# Patient Record
Sex: Female | Born: 1970 | Race: Black or African American | Hispanic: No | Marital: Single | Smoking: Never smoker
Health system: Southern US, Community
[De-identification: ages and names within clinical notes are randomized; demographics above are authoritative.]

---

## 2004-08-27 ENCOUNTER — Other Ambulatory Visit: Admission: RE | Admit: 2004-08-27 | Discharge: 2004-08-27 | Payer: Self-pay | Admitting: Obstetrics and Gynecology

## 2006-09-07 ENCOUNTER — Encounter: Admission: RE | Admit: 2006-09-07 | Discharge: 2006-09-07 | Payer: Self-pay | Admitting: Obstetrics and Gynecology

## 2010-05-20 ENCOUNTER — Encounter
Admission: RE | Admit: 2010-05-20 | Discharge: 2010-05-20 | Payer: Self-pay | Source: Home / Self Care | Attending: Obstetrics and Gynecology | Admitting: Obstetrics and Gynecology

## 2011-06-29 ENCOUNTER — Other Ambulatory Visit: Payer: Self-pay | Admitting: Obstetrics and Gynecology

## 2011-06-29 ENCOUNTER — Other Ambulatory Visit: Payer: Self-pay | Admitting: Family Medicine

## 2011-06-29 DIAGNOSIS — Z1231 Encounter for screening mammogram for malignant neoplasm of breast: Secondary | ICD-10-CM

## 2011-07-08 ENCOUNTER — Ambulatory Visit
Admission: RE | Admit: 2011-07-08 | Discharge: 2011-07-08 | Disposition: A | Discharge status: Home / Self Care | Payer: BC Managed Care – PPO | Source: Ambulatory Visit | Attending: Family Medicine | Admitting: Family Medicine

## 2011-07-08 DIAGNOSIS — Z1231 Encounter for screening mammogram for malignant neoplasm of breast: Secondary | ICD-10-CM

## 2012-05-10 ENCOUNTER — Ambulatory Visit (INDEPENDENT_AMBULATORY_CARE_PROVIDER_SITE_OTHER): Payer: BC Managed Care – PPO | Admitting: Obstetrics and Gynecology

## 2012-05-10 ENCOUNTER — Encounter: Payer: Self-pay | Admitting: Obstetrics and Gynecology

## 2012-05-10 VITALS — BP 100/62 | HR 72 | Resp 16 | Ht 68.0 in | Wt 181.0 lb

## 2012-05-10 DIAGNOSIS — Z01419 Encounter for gynecological examination (general) (routine) without abnormal findings: Secondary | ICD-10-CM

## 2012-05-10 DIAGNOSIS — R7689 Other specified abnormal immunological findings in serum: Secondary | ICD-10-CM | POA: Insufficient documentation

## 2012-05-10 DIAGNOSIS — N6459 Other signs and symptoms in breast: Secondary | ICD-10-CM

## 2012-05-10 DIAGNOSIS — D259 Leiomyoma of uterus, unspecified: Secondary | ICD-10-CM

## 2012-05-10 DIAGNOSIS — N6452 Nipple discharge: Secondary | ICD-10-CM | POA: Insufficient documentation

## 2012-05-10 DIAGNOSIS — D219 Benign neoplasm of connective and other soft tissue, unspecified: Secondary | ICD-10-CM | POA: Insufficient documentation

## 2012-05-10 DIAGNOSIS — R768 Other specified abnormal immunological findings in serum: Secondary | ICD-10-CM

## 2012-05-10 DIAGNOSIS — R894 Abnormal immunological findings in specimens from other organs, systems and tissues: Secondary | ICD-10-CM

## 2012-05-10 MED ORDER — VALACYCLOVIR HCL 500 MG PO TABS: 500.0000 mg | ORAL_TABLET | ORAL | Status: AC | PRN

## 2012-05-10 NOTE — Progress Notes (Signed)
AEX Last Pap: 04/30/2010 WNL: Yes Regular Periods:yes Contraception: None  Monthly Breast exam:yes Tetanus<3yrs:yes Nl.Bladder Function:yes Daily BMs:yes Healthy Diet:yes Calcium:no Mammogram:yes Date of Mammogram: 05/20/2010 WNL Exercise:yes Have often Exercise: 2-3 x wk Seatbelt: yes Abuse at home: no Stressful work:no Sigmoid-colonoscopy: none Bone Density: No PCP: Dr. Janeice Robinson Change in PMH: None Change in JYN:WGNF  Subjective:    Jennifer [name] is a 41 y.o. female, No obstetric history on file., who presents for an annual exam.     History   Social History  . Marital Status: Unknown    Spouse Name: N/A    Number of Children: N/A  . Years of Education: N/A   Social History Main Topics  . Smoking status: Never Smoker   . Smokeless tobacco: None  . Alcohol Use: Yes  . Drug Use: No  . Sexually Active: Yes    Birth Control/ Protection: None   Other Topics Concern  . None   Social History Narrative  . None    Menstrual cycle:   LMP: Patient's last menstrual period was 04/07/2012.           Cycle: Total 8 days of bleeding , starting out light for a few days, only one heavy day then tapers.  No IM bleeding.  everyother month mild cramps which require no meds The following portions of the patient's history were reviewed and updated as appropriate: allergies, current medications, past family history, past medical history, past social history, past surgical history and problem list.  Review of Systems Pertinent items are noted in HPI. Breast:Negative for breast lump or nipple retraction.  She has a 5 year hx of first expressable and now spontaneous clear left breast discharge.  Neg mammogram in 2012. Gastrointestinal: Negative for abdominal pain, change in bowel habits or rectal bleeding Urinary:negative   Objective:    BP 100/62  Pulse 72  Resp 16  Ht 5\' 8"  (1.727 m)  Wt 181 lb (82.101 kg)  BMI 27.52 kg/m2  LMP 04/07/2012    Weight:  Wt Readings from  Last 1 Encounters:  05/10/12 181 lb (82.101 kg)          BMI: Body mass index is 27.52 kg/(m^2).  General Appearance: Alert, appropriate appearance for age. No acute distress HEENT: Grossly normal Neck / Thyroid: Supple, no masses, nodes or enlargement Lungs: clear to auscultation bilaterally Back: No CVA tenderness Breast Exam: No masses or nodes.No dimpling, nipple retraction.  Easily expressable clear discharge from the left breast Cardiovascular: Regular rate and rhythm. S1, S2, no murmur Gastrointestinal: Soft, non-tender, no masses or organomegaly Pelvic Exam: External genitalia: normal general appearance Vaginal: normal mucosa without prolapse or lesions Cervix: normal appearance Adnexa: no masses Uterus: irregular enlargement 8-10 wks size Rectovaginal: normal rectal, no masses Lymphatic Exam: Non-palpable nodes in neck, clavicular, axillary, or inguinal regions  Skin: no rash or abnormalities Neurologic: Normal gait and speech, no tremor  Psychiatric: Alert and oriented, appropriate affect.   Wet Prep:not applicable Urinalysis:not applicable UPT: Not done   Assessment:    Stable asymptomatic fibroids  Persistent, now spontaneous left breast discharge   Plan:   left breast pap Mammogram/ductogram pap smear due 2014.  No hx abnl return annually or prn STD screening: declined Contraception:condoms   Dierdre Forth MD

## 2012-05-12 LAB — CYTOLOGY - NON PAP

## 2012-05-13 ENCOUNTER — Other Ambulatory Visit: Payer: Self-pay

## 2012-05-13 DIAGNOSIS — N6452 Nipple discharge: Secondary | ICD-10-CM

## 2012-05-23 ENCOUNTER — Ambulatory Visit
Admission: RE | Admit: 2012-05-23 | Discharge: 2012-05-23 | Disposition: A | Discharge status: Home / Self Care | Payer: BC Managed Care – PPO | Source: Ambulatory Visit | Attending: Obstetrics and Gynecology | Admitting: Obstetrics and Gynecology

## 2012-05-23 ENCOUNTER — Ambulatory Visit
Admission: RE | Admit: 2012-05-23 | Discharge: 2012-05-23 | Disposition: A | Discharge status: Home / Self Care | Payer: Self-pay | Source: Ambulatory Visit | Attending: Obstetrics and Gynecology | Admitting: Obstetrics and Gynecology

## 2012-05-23 ENCOUNTER — Encounter: Payer: Self-pay | Admitting: Obstetrics and Gynecology

## 2012-05-23 DIAGNOSIS — N6452 Nipple discharge: Secondary | ICD-10-CM

## 2012-05-23 DIAGNOSIS — R922 Inconclusive mammogram: Secondary | ICD-10-CM | POA: Insufficient documentation

## 2012-08-01 ENCOUNTER — Other Ambulatory Visit: Payer: Self-pay | Admitting: Obstetrics and Gynecology

## 2012-08-01 DIAGNOSIS — N6452 Nipple discharge: Secondary | ICD-10-CM

## 2012-11-21 ENCOUNTER — Ambulatory Visit
Admission: RE | Admit: 2012-11-21 | Discharge: 2012-11-21 | Disposition: A | Discharge status: Home / Self Care | Payer: BC Managed Care – PPO | Source: Ambulatory Visit | Attending: Obstetrics and Gynecology | Admitting: Obstetrics and Gynecology

## 2012-11-21 DIAGNOSIS — N6452 Nipple discharge: Secondary | ICD-10-CM

## 2014-04-13 ENCOUNTER — Other Ambulatory Visit: Payer: Self-pay | Admitting: Family Medicine

## 2014-04-13 DIAGNOSIS — N63 Unspecified lump in unspecified breast: Secondary | ICD-10-CM

## 2014-04-26 ENCOUNTER — Ambulatory Visit
Admission: RE | Admit: 2014-04-26 | Discharge: 2014-04-26 | Disposition: A | Discharge status: Home / Self Care | Payer: BC Managed Care – PPO | Source: Ambulatory Visit | Attending: Family Medicine | Admitting: Family Medicine

## 2014-04-26 ENCOUNTER — Encounter (INDEPENDENT_AMBULATORY_CARE_PROVIDER_SITE_OTHER): Payer: Self-pay

## 2014-04-26 DIAGNOSIS — N63 Unspecified lump in unspecified breast: Secondary | ICD-10-CM

## 2015-06-26 ENCOUNTER — Other Ambulatory Visit: Payer: Self-pay | Admitting: Obstetrics and Gynecology

## 2015-06-26 DIAGNOSIS — R928 Other abnormal and inconclusive findings on diagnostic imaging of breast: Secondary | ICD-10-CM

## 2015-07-03 ENCOUNTER — Other Ambulatory Visit: Payer: Self-pay

## 2015-07-09 ENCOUNTER — Other Ambulatory Visit: Payer: Self-pay

## 2015-09-06 ENCOUNTER — Ambulatory Visit: Payer: Self-pay | Admitting: Family

## 2015-09-11 ENCOUNTER — Encounter: Payer: Self-pay | Admitting: Family Medicine

## 2015-09-11 ENCOUNTER — Ambulatory Visit (INDEPENDENT_AMBULATORY_CARE_PROVIDER_SITE_OTHER): Payer: BLUE CROSS/BLUE SHIELD | Admitting: Family Medicine

## 2015-09-11 VITALS — BP 100/52 | HR 78 | Temp 98.7°F | Ht 68.5 in | Wt 183.0 lb

## 2015-09-11 DIAGNOSIS — M67952 Unspecified disorder of synovium and tendon, left thigh: Secondary | ICD-10-CM | POA: Diagnosis not present

## 2015-09-11 DIAGNOSIS — M545 Low back pain, unspecified: Secondary | ICD-10-CM

## 2015-09-11 DIAGNOSIS — M533 Sacrococcygeal disorders, not elsewhere classified: Secondary | ICD-10-CM

## 2015-09-11 MED ORDER — DICLOFENAC SODIUM 75 MG PO TBEC: 75.0000 mg | DELAYED_RELEASE_TABLET | Freq: Two times a day (BID) | ORAL | Status: DC

## 2015-09-11 NOTE — Progress Notes (Signed)
Pre visit review using our clinic review tool, if applicable. No additional management support is needed unless otherwise documented below in the visit note. 

## 2015-09-11 NOTE — Progress Notes (Signed)
Dr. Frederico Hamman T. Tinisha Etzkorn, MD, [facility] [place], [address] Phone: [phone] Fax: [phone]  09/11/2015  Patient: Jennifer [name], MRN: [id number], DOB: [birth year], 45 y.o.  Primary Physician:  Tawanna Solo, MD   Chief Complaint  Patient presents with  . Back Pain    Low Back   Subjective:   Jennifer [name] is a 45 y.o. very pleasant female patient who presents with the following: Back Pain  ongoing for approximately: 7-10 days The patient has had back pain before. The back pain is localized into the lumbar spine area. They also describe no radiculopathy. She is primarily having pain in the lower back as well as in the SI joint region.  She also has some left-sided upper buttocks pain.  Only new thing that she could've done that she can remember is using a stair climber in the gym for some time, and this was a little bit over a week ago.  She has taken some Advil which has not helped all that much.  For about a week, some general soreneess and stiffness. Going out of town on sat.  Driving - then a plane flight.   Pain around buttocks and SI joints.  Gym and weights.  No incontinence.   L > R SI joints Glute medius  No numbness or tingling. No bowel or bladder incontinence. No focal weakness. Prior interventions: no Physical therapy: No Chiropractic manipulations: No Acupuncture: No Osteopathic manipulation: No Heat or cold: Minimal effect  Past Medical History, Surgical History, Family History, Medications, Allergies have been reviewed and updated if relevant.  Patient Active Problem List   Diagnosis Date Noted  . Dense breasts 05/23/2012  . 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 Fibroids 05/10/2012  . Nipple discharge 05/10/2012    No past medical history on file.  No past surgical history on file.  Social History   Social History  . Marital Status: Single    Spouse Name: N/A  . Number of  Children: N/A  . Years of Education: N/A   Occupational History  . Not on file.   Social History Main Topics  . Smoking status: Never Smoker   . Smokeless tobacco: Never Used  . Alcohol Use: 0.0 oz/week    0 Standard drinks or equivalent per week  . Drug Use: No  . Sexual Activity: Yes    Birth Control/ Protection: None   Other Topics Concern  . Not on file   Social History Narrative    Family History  Problem Relation Age of Onset  . Cancer Father     No Known Allergies  Medication list reviewed and updated in full in [facility].  GEN: No fevers, chills. Nontoxic. Primarily MSK c/o today. MSK: Detailed in the HPI GI: tolerating PO intake without difficulty Neuro: As above  Otherwise the pertinent positives of the ROS are noted above.    Objective:   Blood pressure 100/52, pulse 78, temperature 98.7 F (37.1 C), temperature source Oral, height 5' 8.5" (1.74 m), weight 183 lb (83.008 kg), last menstrual period 09/09/2015.  Gen: Well-developed,well-nourished,in no acute distress; alert,appropriate and cooperative throughout examination HEENT: Normocephalic and atraumatic without obvious abnormalities.  Ears, externally no deformities Pulm: Breathing comfortably in no respiratory distress Range of motion at  the waist: Flexion, rotation and lateral bending: preserved with approaching full flexion, full extension and lateral bending and rotational movements.  No echymosis or edema Rises to examination table with no  difficulty Gait: minimally antalgic  Inspection/Deformity: No abnormality Paraspinus T:  Mildly tender from L4-S1.  B Ankle Dorsiflexion (L5,4): 5/5 B Great Toe Dorsiflexion (L5,4): 5/5 Heel Walk (L5): WNL Toe Walk (S1): WNL Rise/Squat (L4): WNL, mild pain  SENSORY B Medial Foot (L4): WNL B Dorsum (L5): WNL B Lateral (S1): WNL Light Touch: WNL Pinprick: WNL  REFLEXES Knee (L4): 2+ Ankle (S1): 2+  B SLR, seated: neg B SLR, supine:  pain in L back only B FABER: mild mod pos L B Reverse FABER: mild ttp B Greater Troch: NT B Log Roll: neg B Stork: NT B Sciatic Notch: notably TTP B, L > R  ttender to palpation on the posterior pelvic rim on the left.  Radiology: No results found.  Assessment and Plan:   Sacroiliac joint dysfunction of both sides  Bilateral low back pain without sciatica  Tendinopathy of left gluteus medius  Suspect induction secondary to increased volume on stairmaster machine, new exercise.  SI joint dysfunction, acute on the left, and to a lesser degree on the right.  There is also some minor back involvement and pain in the gluteus minimus insertion on the posterior pelvis.    Reviewed piriformis equivalent stretching and rehabilitation with the patient along with some self SI maneuvers - handout given.  Would anticipate relatively short course.  Voltaren p.o. B.i.d. For now.  Follow-up: if her symptoms do not resolve, she is going to keep a previously scheduled appt with my partner Dr. Tamala Julian in 2 weeks.  New Prescriptions   DICLOFENAC (VOLTAREN) 75 MG EC TABLET    Take 1 tablet (75 mg total) by mouth 2 (two) times daily.   Patient Instructions  Sacroiliac Joint Mobilization and Rehab 2. hip abductor rotations. standing, hip flexion and rotation outward then inward. 3 sets, 15 reps. when can do comfortably, add ankle weights starting at 2 pounds.  4. rolling up and back knees to chest and rocking.     Signed,  Maud Deed. Siboney Requejo, MD   Patient's Medications  New Prescriptions   DICLOFENAC (VOLTAREN) 75 MG EC TABLET    Take 1 tablet (75 mg total) by mouth 2 (two) times daily.  Previous Medications   VALACYCLOVIR (VALTREX) 500 MG TABLET    Take 1 tablet (500 mg total) by mouth continuous as needed.  Modified Medications   No medications on file  Discontinued Medications   No medications on file

## 2015-09-11 NOTE — Patient Instructions (Signed)
Sacroiliac Joint Mobilization and Rehab 2. hip abductor rotations. standing, hip flexion and rotation outward then inward. 3 sets, 15 reps. when can do comfortably, add ankle weights starting at 2 pounds.  4. rolling up and back knees to chest and rocking.

## 2015-09-24 ENCOUNTER — Ambulatory Visit: Payer: Self-pay | Admitting: Family Medicine

## 2016-01-29 NOTE — Progress Notes (Signed)
[facility] [address] [place], [place] [address] Phone: [phone] Subjective:    CC: leg problem.  [id number]  Jennifer [name] is a 45 y.o. female coming in with complaint of leg problem. Patient has been diagnosed with sacroiliac joint dysfunction previously bilaterally. This was by another provider. Patient states Pain mostly seems to be on the posterior aspect the leg. Seems worse after sitting for long amount of time. When she goes down to tie her shoes seems to be worse. Sometimes can catch her breath. Denies any weakness. Seems to be worsening over the course of time. Patient is concerned because she would like to be active but is unable to do things such running. Patient states laying down flat though is seeming to be beneficial.     No past medical history on file. No past surgical history on file. Social History   Social History  . Marital status: Single    Spouse name: N/A  . Number of children: N/A  . Years of education: N/A   Social History Main Topics  . Smoking status: Never Smoker  . Smokeless tobacco: Never Used  . Alcohol use 0.0 oz/week  . Drug use: No  . Sexual activity: Yes    Birth control/ protection: None   Other Topics Concern  . None   Social History Narrative  . None   No Known Allergies Family History  Problem Relation Age of Onset  . Cancer Father     Past medical history, social, surgical and family history all reviewed in electronic medical record.  No pertanent information unless stated regarding to the chief complaint.   Review of Systems: No headache, visual changes, nausea, vomiting, diarrhea, constipation, dizziness, abdominal pain, skin rash, fevers, chills, night sweats, weight loss, swollen lymph nodes, body aches, joint swelling, muscle aches, chest pain, shortness of breath, mood changes.   Objective  Blood pressure 122/74, pulse 77, height 5' 8.5" (1.74 m), weight 195 lb (88.5 kg), SpO2 98  %.  General: No apparent distress alert and oriented x3 mood and affect normal, dressed appropriately.  HEENT: Pupils equal, extraocular movements intact  Respiratory: Patient's speak in full sentences and does not appear short of breath  Cardiovascular: No lower extremity edema, non tender, no erythema  Skin: Warm dry intact with no signs of infection or rash on extremities or on axial skeleton.  Abdomen: Soft nontender  Neuro: Cranial nerves II through XII are intact, neurovascularly intact in all extremities with 2+ DTRs and 2+ pulses.  Lymph: No lymphadenopathy of posterior or anterior cervical chain or axillae bilaterally.  Gait normal with good balance and coordination.  MSK:  Non tender with full range of motion and good stability and symmetric strength and tone of shoulders, elbows, wrist, hip, knee and ankles bilaterally.  Back Exam:  Inspection: Unremarkable  Motion: Flexion 25 deg with worsening radicular symptoms, Extension 35 deg, Side Bending to 45 deg bilaterally,  Rotation to 45 deg bilaterally  SLR laying: Positive left side XSLR laying: Negative  Palpable tenderness: Tender to palpation in the paraspinal musculature over L4-S1 on the left side FABER: negative. Sensory change: Gross sensation intact to all lumbar and sacral dermatomes.  Reflexes: 2+ at both patellar tendons, 2+ at achilles tendons, Babinski's downgoing.  Strength at foot  Plantar-flexion: 5/5 Dorsi-flexion: 5/5 Eversion: 5/5 Inversion: 5/5  Leg strength  Quad: 5/5 Hamstring: 5/5 Hip flexor: 5/5 Hip abductors: 4/5  Gait unremarkable.  Procedure note [id number]; 15 minutes spent  for Therapeutic exercises as stated in above notes.  This included exercises focusing on stretching, strengthening, with significant focus on eccentric aspects.   Low back exercises that included:  Pelvic tilt/bracing instruction to focus on control of the pelvic girdle and lower abdominal muscles  Glute strengthening exercises,  focusing on proper firing of the glutes without engaging the low back muscles Proper stretching techniques for maximum relief for the hamstrings, hip flexors, low back and some rotation where tolerated  Proper technique shown and discussed handout in great detail with ATC.  All questions were discussed and answered.     Impression and Recommendations:     This case required medical decision making of moderate complexity.      Note: This dictation was prepared with Dragon dictation along with smaller phrase technology. Any transcriptional errors that result from this process are unintentional.

## 2016-01-30 ENCOUNTER — Ambulatory Visit (INDEPENDENT_AMBULATORY_CARE_PROVIDER_SITE_OTHER): Payer: BLUE CROSS/BLUE SHIELD | Admitting: Family Medicine

## 2016-01-30 ENCOUNTER — Encounter: Payer: Self-pay | Admitting: Family Medicine

## 2016-01-30 ENCOUNTER — Ambulatory Visit (INDEPENDENT_AMBULATORY_CARE_PROVIDER_SITE_OTHER)
Admission: RE | Admit: 2016-01-30 | Discharge: 2016-01-30 | Disposition: A | Discharge status: Home / Self Care | Payer: BLUE CROSS/BLUE SHIELD | Source: Ambulatory Visit | Attending: Family Medicine | Admitting: Family Medicine

## 2016-01-30 VITALS — BP 122/74 | HR 77 | Ht 68.5 in | Wt 195.0 lb

## 2016-01-30 DIAGNOSIS — M545 Low back pain, unspecified: Secondary | ICD-10-CM

## 2016-01-30 DIAGNOSIS — M5416 Radiculopathy, lumbar region: Secondary | ICD-10-CM

## 2016-01-30 MED ORDER — PREDNISONE 50 MG PO TABS: 50.0000 mg | ORAL_TABLET | Freq: Every day | ORAL | 0 refills | Status: DC

## 2016-01-30 MED ORDER — GABAPENTIN 100 MG PO CAPS: 200.0000 mg | ORAL_CAPSULE | Freq: Every day | ORAL | 3 refills | Status: DC

## 2016-01-30 NOTE — Assessment & Plan Note (Signed)
Patient has what appears to be more of a lumbar radiculopathy. Patient likely has some distal iliotibial band syndrome but I do not think that is what is causing the radicular symptoms. Patient does have a positive straight leg test butcan weakness. Patient describes the pain still has more intermittent. No weakness noted. At this point patient will start on prednisone and gabapentin. We discussed icing regimen. Given home exercises and work with Product/process development scientist. Patient will try these regimen and come back and see me again in 2-3 weeks. Patient could be a candidate for manipulation or if worsening symptoms consider formal physical therapy. If weakness occurs patient and will have to have advanced imaging.

## 2016-01-30 NOTE — Patient Instructions (Addendum)
Good to see you.  Xray downstairs Ice 20 minutes 2 times daily. Usually after activity and before bed. Exercises 3 times a week.  Prednisone daily for 5 days Gabapentin 200mg  at night Avoid heavy lifting.  Shoes with a rigid sole and avoid being barefoot.  See me again in 2-3 weeks and we will consider either physical therapy or consider manipulation

## 2016-02-19 NOTE — Progress Notes (Signed)
[facility] [address] [place], [place] [address] Phone: [phone] Subjective:    CC: leg problem f/u [id number]  Jennifer [name] is a 45 y.o. female coming in with complaint of leg problem. Patient was seen previously and was having more of a lumbar radiculopathy. Patient was given prednisone and gabapentin and home exercises. Patient states Doing much better. Patient states about 80% better. Still some mild intermittent discomfort on the left side as well as some intermittent pain going down the leg but nothing severe. Patient does like she has made some progress. Starting to work out even on a regular basis.     No past medical history on file. No past surgical history on file. Social History   Social History  . Marital status: Single    Spouse name: N/A  . Number of children: N/A  . Years of education: N/A   Social History Main Topics  . Smoking status: Never Smoker  . Smokeless tobacco: Never Used  . Alcohol use 0.0 oz/week  . Drug use: No  . Sexual activity: Yes    Birth control/ protection: None   Other Topics Concern  . None   Social History Narrative  . None   No Known Allergies Family History  Problem Relation Age of Onset  . Cancer Father     Past medical history, social, surgical and family history all reviewed in electronic medical record.  No pertanent information unless stated regarding to the chief complaint.   Review of Systems: No headache, visual changes, nausea, vomiting, diarrhea, constipation, dizziness, abdominal pain, skin rash, fevers, chills, night sweats, weight loss, swollen lymph nodes, body aches, joint swelling, muscle aches, chest pain, shortness of breath, mood changes.   Objective  Blood pressure 122/82, pulse 91, weight 198 lb (89.8 kg), last menstrual period 01/04/2016, SpO2 96 %.  General: No apparent distress alert and oriented x3 mood and affect normal, dressed appropriately.  HEENT: Pupils  equal, extraocular movements intact  Respiratory: Patient's speak in full sentences and does not appear short of breath  Cardiovascular: No lower extremity edema, non tender, no erythema  Skin: Warm dry intact with no signs of infection or rash on extremities or on axial skeleton.  Abdomen: Soft nontender  Neuro: Cranial nerves II through XII are intact, neurovascularly intact in all extremities with 2+ DTRs and 2+ pulses.  Lymph: No lymphadenopathy of posterior or anterior cervical chain or axillae bilaterally.  Gait normal with good balance and coordination.  MSK:  Non tender with full range of motion and good stability and symmetric strength and tone of shoulders, elbows, wrist, hip, knee and ankles bilaterally.  Back Exam:  Inspection: Unremarkable  Motion: Flexion 35 deg , Extension 25 deg, Side Bending to 45 deg bilaterally,  Rotation to 45 deg bilaterally  SLR laying: Mild positive XSLR laying: Negative  Palpable tenderness: Significantly less tender than previous exam FABER: negative. Sensory change: Gross sensation intact to all lumbar and sacral dermatomes.  Reflexes: 2+ at both patellar tendons, 2+ at achilles tendons, Babinski's downgoing.  Strength at foot  Plantar-flexion: 5/5 Dorsi-flexion: 5/5 Eversion: 5/5 Inversion: 5/5  Leg strength  Quad: 5/5 Hamstring: 5/5 Hip flexor: 5/5 Hip abductors: 4+/5  Gait unremarkable.  Osteopathic findings T3 extended rotated and side bent right L2 flexed rotated and side bent right Sacrum left on left    Impression and Recommendations:     This case required medical decision making of moderate complexity.  Note: This dictation was prepared with Dragon dictation along with smaller phrase technology. Any transcriptional errors that result from this process are unintentional.

## 2016-02-20 ENCOUNTER — Ambulatory Visit (INDEPENDENT_AMBULATORY_CARE_PROVIDER_SITE_OTHER): Payer: BLUE CROSS/BLUE SHIELD | Admitting: Family Medicine

## 2016-02-20 ENCOUNTER — Encounter: Payer: Self-pay | Admitting: Family Medicine

## 2016-02-20 DIAGNOSIS — M999 Biomechanical lesion, unspecified: Secondary | ICD-10-CM | POA: Diagnosis not present

## 2016-02-20 DIAGNOSIS — M5416 Radiculopathy, lumbar region: Secondary | ICD-10-CM

## 2016-02-20 NOTE — Assessment & Plan Note (Signed)
Continues to have some mild intermittent radicular symptoms. Encourage her to continue the gabapentin at this time. We discussed icing regimen and home exercises. Patient will work on core strength. Decline formal physical therapy. She'll come back and see me again in 4-6 weeks.

## 2016-02-20 NOTE — Patient Instructions (Signed)
Have great weekend Ice is your friend Stay active and do the exercises Tried the manipulation  See me again in 4-6 weeks!

## 2016-02-20 NOTE — Assessment & Plan Note (Signed)
Decision today to treat with OMT was based on Physical Exam  After verbal consent patient was treated with HVLA, ME techniques in thoracic, lumbar and sacral areas  Patient tolerated the procedure well with improvement in symptoms  Patient given exercises, stretches and lifestyle modifications  See medications in patient instructions if given  Patient will follow up in 4-6 weeks          

## 2016-03-31 NOTE — Progress Notes (Deleted)
[name] [address], [place] [address] Phone: [phone] Subjective:    CC: leg problem f/u [id number]  Jennifer [name] is a 45 y.o. female coming in with complaint of leg problem. Patient was seen previously and was having more of a lumbar radiculopathy. Patient was given prednisone and gabapentin and home exercises. Patient was doing 80% better at last follow-up and we did start osteopathic manipulation. Patient started more aggressive home exercises as well as core strengthening instability. Patient states     No past medical history on file. No past surgical history on file. Social History   Social History  . Marital status: Single    Spouse name: N/A  . Number of children: N/A  . Years of education: N/A   Social History Main Topics  . Smoking status: Never Smoker  . Smokeless tobacco: Never Used  . Alcohol use 0.0 oz/week  . Drug use: No  . Sexual activity: Yes    Birth control/ protection: None   Other Topics Concern  . Not on file   Social History Narrative  . No narrative on file   No Known Allergies Family History  Problem Relation Age of Onset  . Cancer Father     Past medical history, social, surgical and family history all reviewed in electronic medical record.  No pertanent information unless stated regarding to the chief complaint.   Review of Systems: No headache, visual changes, nausea, vomiting, diarrhea, constipation, dizziness, abdominal pain, skin rash, fevers, chills, night sweats, weight loss, swollen lymph nodes, body aches, joint swelling, muscle aches, chest pain, shortness of breath, mood changes.   Objective  There were no vitals taken for this visit.  General: No apparent distress alert and oriented x3 mood and affect normal, dressed appropriately.  HEENT: Pupils equal, extraocular movements intact  Respiratory: Patient's speak in full sentences and does not appear short of breath    Cardiovascular: No lower extremity edema, non tender, no erythema  Skin: Warm dry intact with no signs of infection or rash on extremities or on axial skeleton.  Abdomen: Soft nontender  Neuro: Cranial nerves II through XII are intact, neurovascularly intact in all extremities with 2+ DTRs and 2+ pulses.  Lymph: No lymphadenopathy of posterior or anterior cervical chain or axillae bilaterally.  Gait normal with good balance and coordination.  MSK:  Non tender with full range of motion and good stability and symmetric strength and tone of shoulders, elbows, wrist, hip, knee and ankles bilaterally.  Back Exam:  Inspection: Unremarkable  Motion: Flexion 35 deg , Extension 25 deg, Side Bending to 45 deg bilaterally,  Rotation to 45 deg bilaterally  SLR laying: Mild positive XSLR laying: Negative  Palpable tenderness: Significantly less tender than previous exam FABER: negative. Sensory change: Gross sensation intact to all lumbar and sacral dermatomes.  Reflexes: 2+ at both patellar tendons, 2+ at achilles tendons, Babinski's downgoing.  Strength at foot  Plantar-flexion: 5/5 Dorsi-flexion: 5/5 Eversion: 5/5 Inversion: 5/5  Leg strength  Quad: 5/5 Hamstring: 5/5 Hip flexor: 5/5 Hip abductors: 4+/5  Gait unremarkable.  Osteopathic findings T3 extended rotated and side bent right L2 flexed rotated and side bent right Sacrum left on left    Impression and Recommendations:     This case required medical decision making of moderate complexity.      Note: This dictation was prepared with Dragon dictation along with smaller phrase technology. Any transcriptional errors that result from this process are unintentional.

## 2016-04-01 ENCOUNTER — Ambulatory Visit: Payer: BLUE CROSS/BLUE SHIELD | Admitting: Family Medicine

## 2016-04-01 DIAGNOSIS — Z0289 Encounter for other administrative examinations: Secondary | ICD-10-CM

## 2016-06-18 ENCOUNTER — Other Ambulatory Visit: Payer: Self-pay | Admitting: Obstetrics and Gynecology

## 2016-06-18 DIAGNOSIS — R928 Other abnormal and inconclusive findings on diagnostic imaging of breast: Secondary | ICD-10-CM

## 2016-06-25 ENCOUNTER — Ambulatory Visit
Admission: RE | Admit: 2016-06-25 | Discharge: 2016-06-25 | Disposition: A | Discharge status: Home / Self Care | Payer: BLUE CROSS/BLUE SHIELD | Source: Ambulatory Visit | Attending: Obstetrics and Gynecology | Admitting: Obstetrics and Gynecology

## 2016-06-25 DIAGNOSIS — R928 Other abnormal and inconclusive findings on diagnostic imaging of breast: Secondary | ICD-10-CM

## 2017-03-11 DIAGNOSIS — F99 Mental disorder, not otherwise specified: Secondary | ICD-10-CM | POA: Diagnosis not present

## 2017-03-22 DIAGNOSIS — F99 Mental disorder, not otherwise specified: Secondary | ICD-10-CM | POA: Diagnosis not present

## 2017-04-03 IMAGING — DX DG LUMBAR SPINE COMPLETE 4+V
5 series · 5 of 5 positions shown · non-contrast
Comparison: None.

CLINICAL DATA: Chronic lower back pain and left-sided sciatica
without known injury.

EXAM:
LUMBAR SPINE - COMPLETE 4+ VIEW

[l-spine ap]
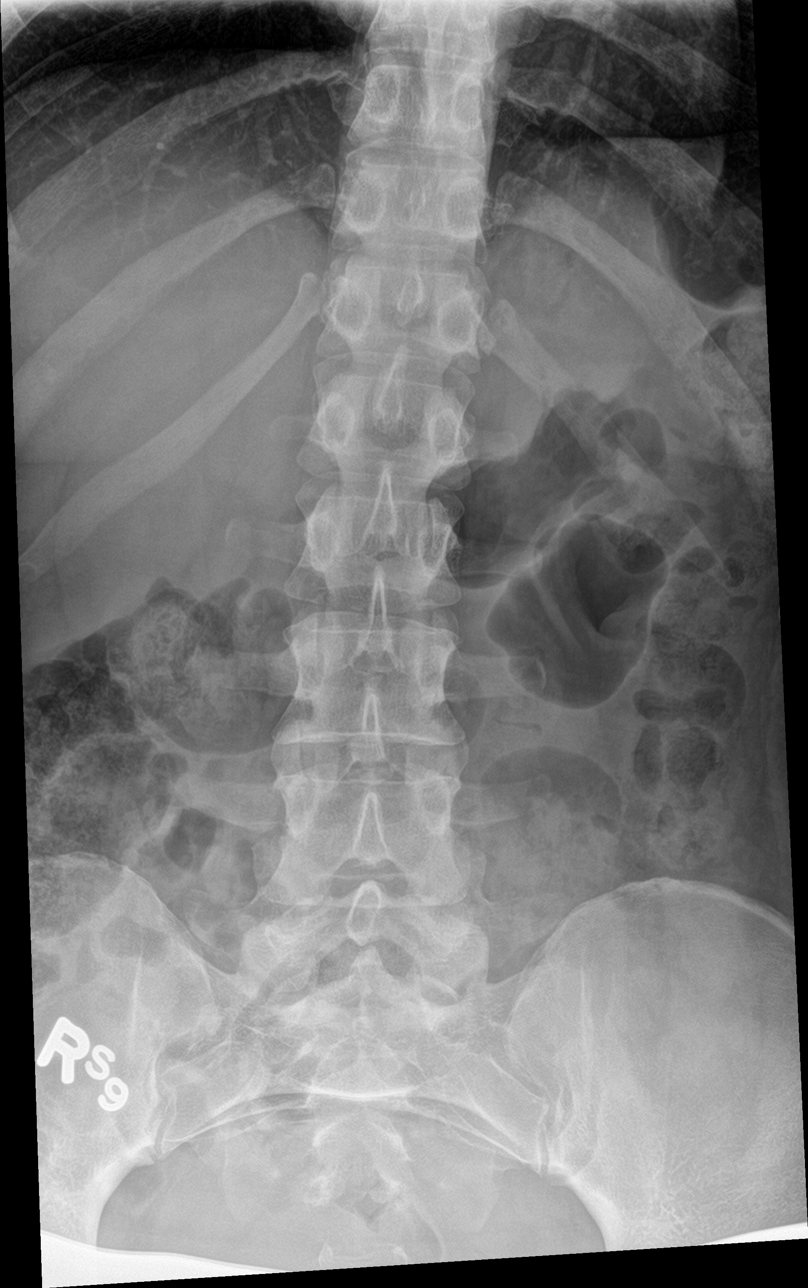

[l-spine obl (1 of 2)]
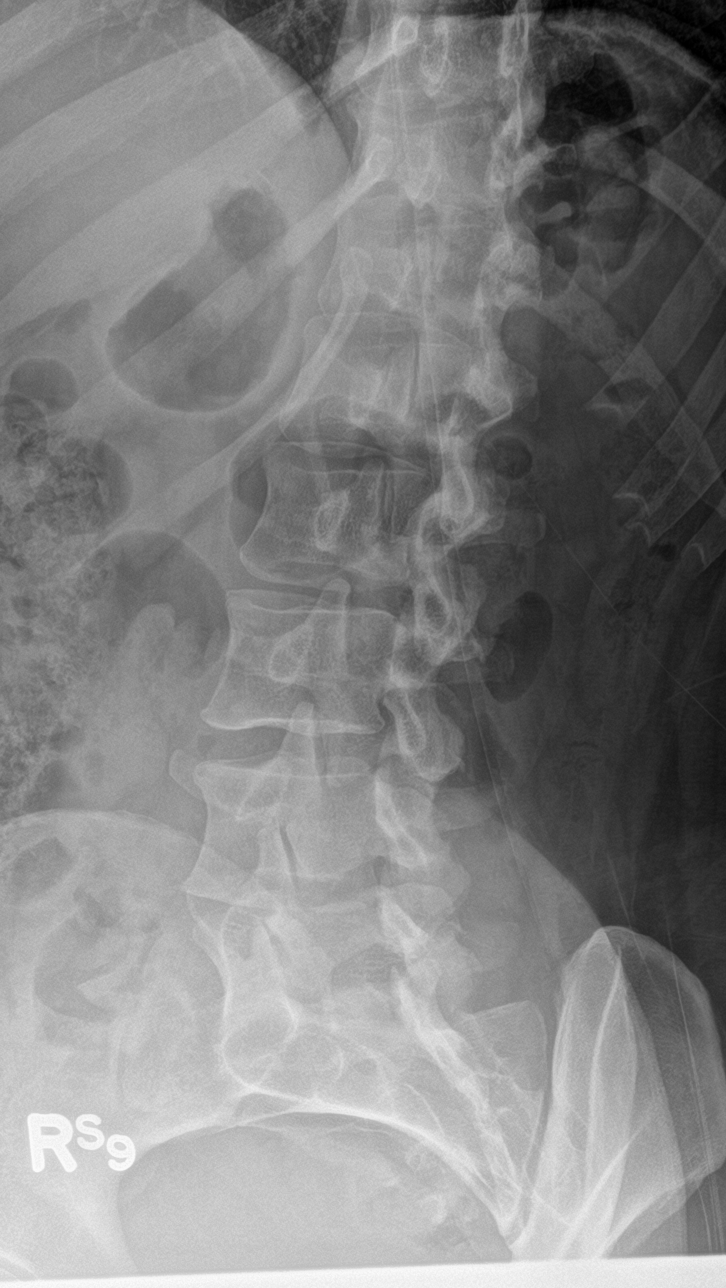

[l-spine obl (2 of 2)]
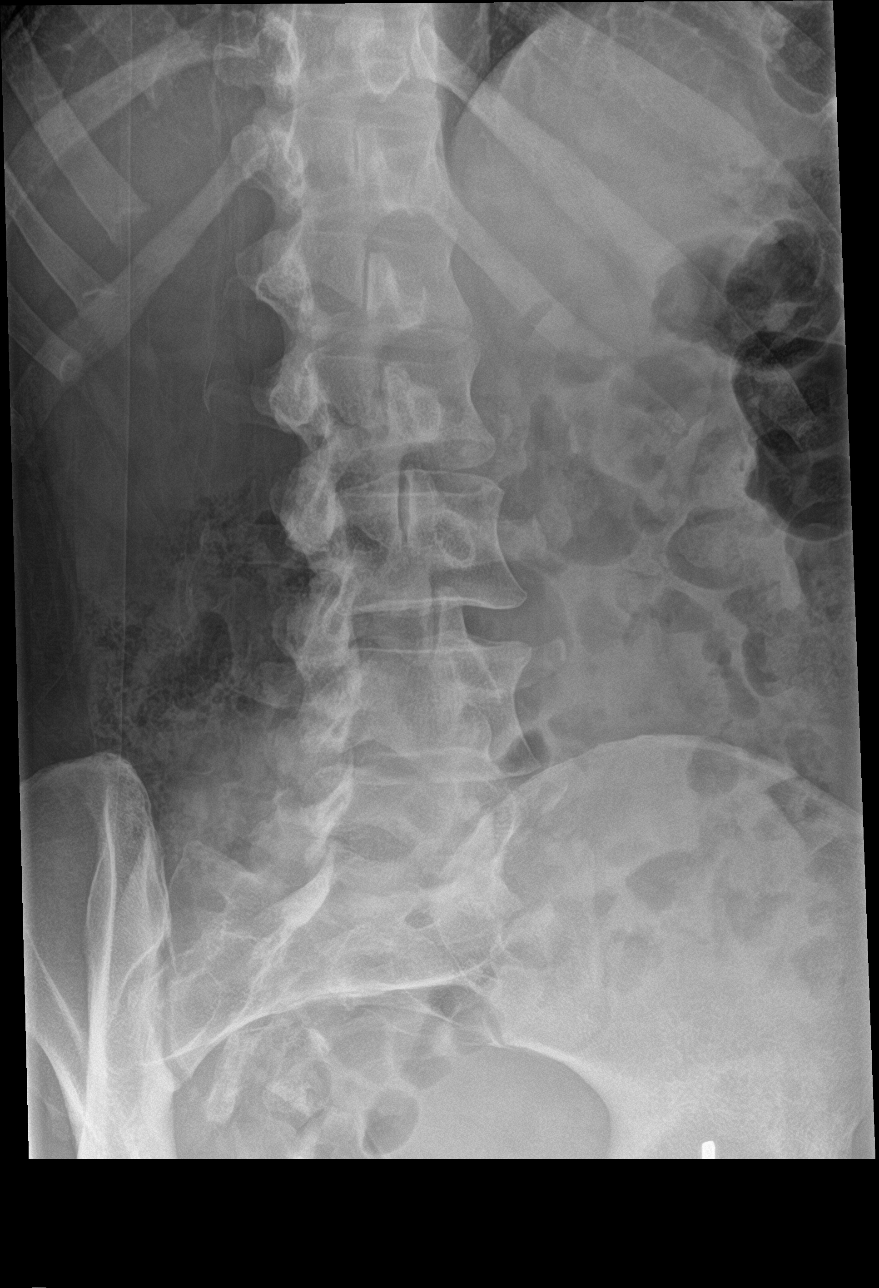

[l-spine lat]
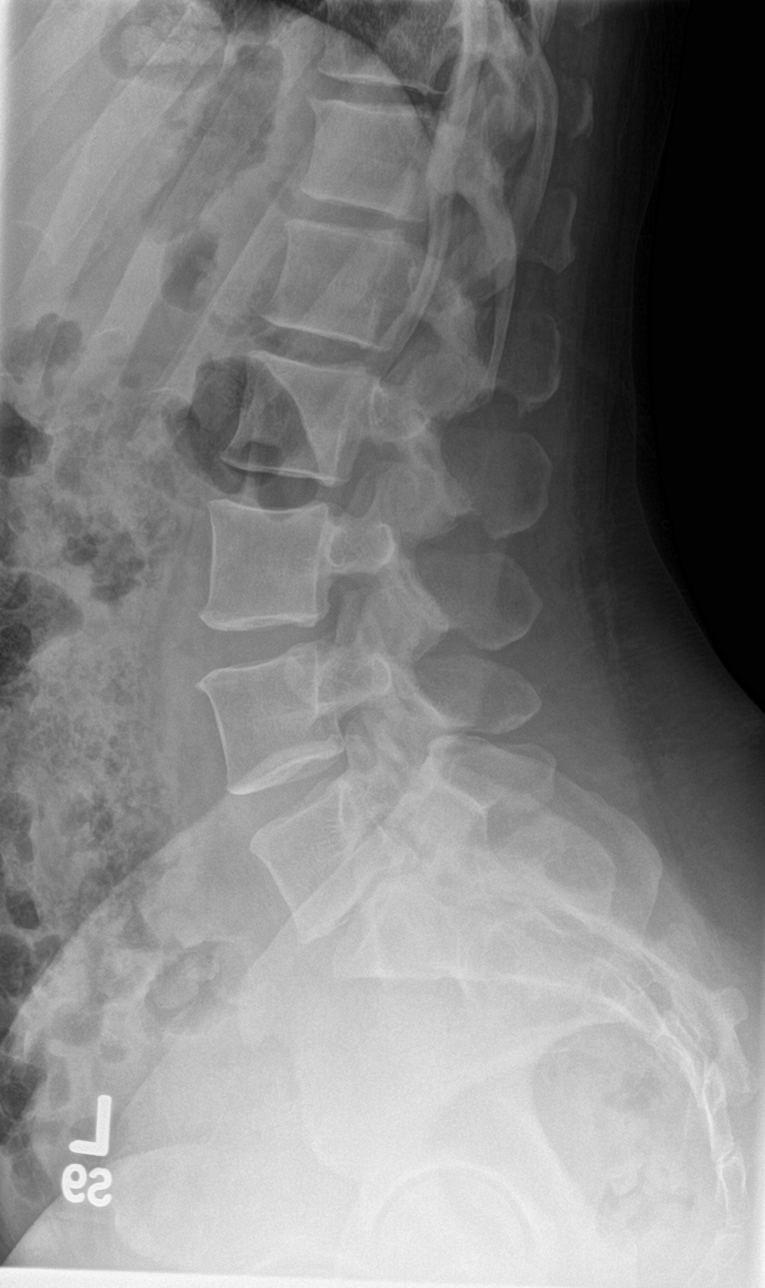

[l-spine spot]
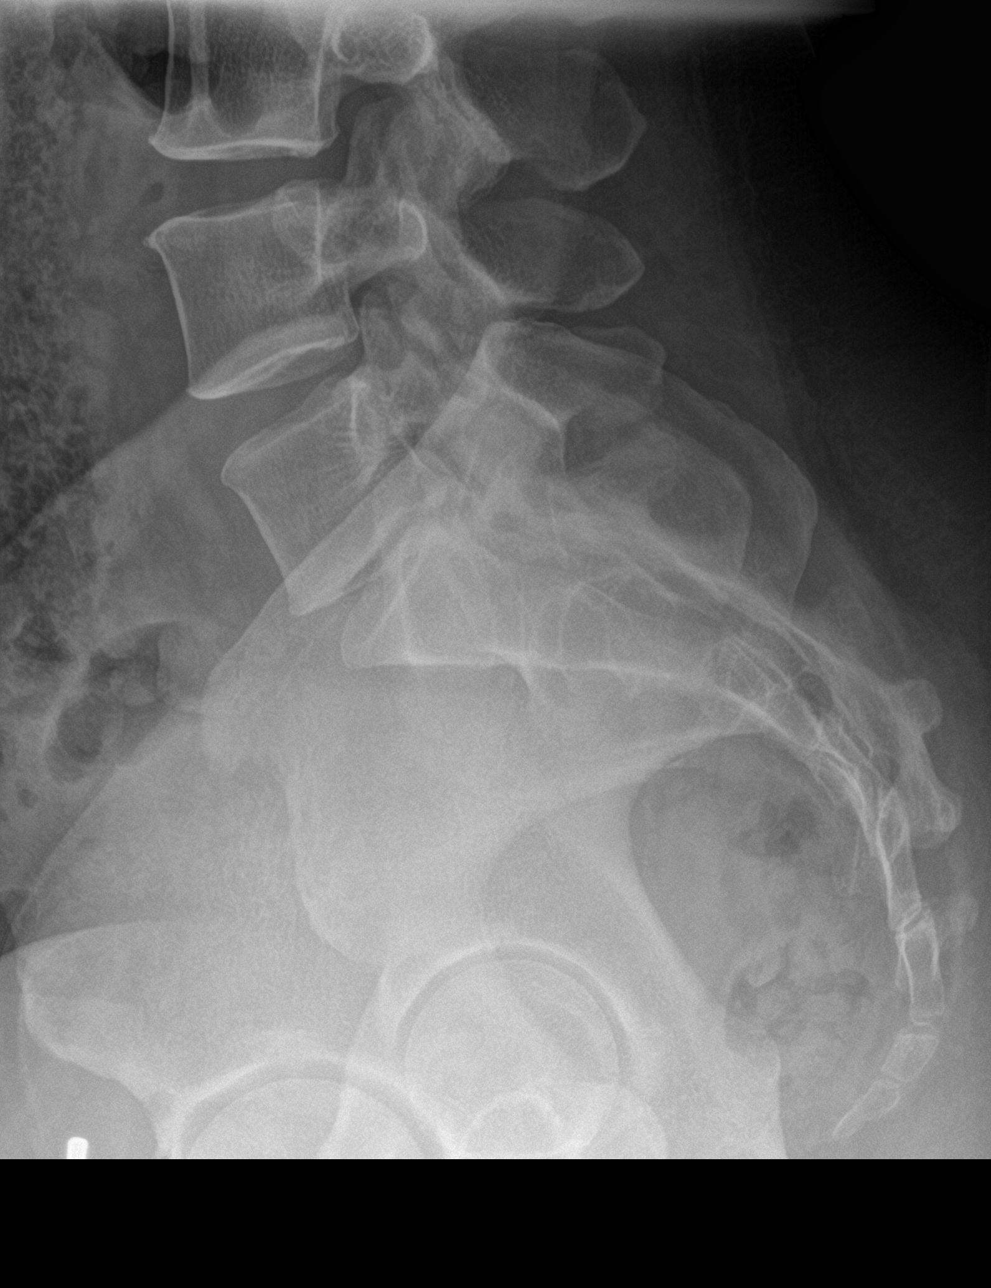

[5 of 5 positions shown; findings below may reference images not displayed]

FINDINGS: There is no evidence of lumbar spine fracture. Alignment is normal.
Intervertebral disc spaces are maintained.
IMPRESSION: Normal lumbar spine.

## 2017-04-05 DIAGNOSIS — F99 Mental disorder, not otherwise specified: Secondary | ICD-10-CM | POA: Diagnosis not present

## 2017-06-25 DIAGNOSIS — Z01411 Encounter for gynecological examination (general) (routine) with abnormal findings: Secondary | ICD-10-CM | POA: Diagnosis not present

## 2017-06-25 DIAGNOSIS — D259 Leiomyoma of uterus, unspecified: Secondary | ICD-10-CM | POA: Diagnosis not present

## 2017-06-25 DIAGNOSIS — N920 Excessive and frequent menstruation with regular cycle: Secondary | ICD-10-CM | POA: Diagnosis not present

## 2017-06-25 DIAGNOSIS — Z1231 Encounter for screening mammogram for malignant neoplasm of breast: Secondary | ICD-10-CM | POA: Diagnosis not present

## 2017-07-22 DIAGNOSIS — H40003 Preglaucoma, unspecified, bilateral: Secondary | ICD-10-CM | POA: Diagnosis not present

## 2017-09-30 DIAGNOSIS — H40013 Open angle with borderline findings, low risk, bilateral: Secondary | ICD-10-CM | POA: Diagnosis not present

## 2017-11-24 DIAGNOSIS — E7800 Pure hypercholesterolemia, unspecified: Secondary | ICD-10-CM | POA: Diagnosis not present

## 2017-11-24 DIAGNOSIS — Z0000 Encounter for general adult medical examination without abnormal findings: Secondary | ICD-10-CM | POA: Diagnosis not present

## 2018-05-18 NOTE — Progress Notes (Signed)
[facility] [address] [name], [place] [address] Phone: [phone] Subjective:   Jennifer [name], am serving as a scribe for Dr. Hulan Saas.   CC: Neck pain  VEH:[id number]  Jennifer [name] is a 48 y.o. female coming in with complaint of neck pain and tightness. Pain occurs during the day at work. Does use an adjustable standing desk. Doesn't stand every day. Denies any radiating symptoms. Does not get headaches from neck pain. Patient experiences blurred vision when backing her car up but is unsure if her neck hurts at that same time. Does not stretch.      [name] past medical history on file. [name] past surgical history on file. Social History   Socioeconomic History  . Marital status: Single    Spouse name: Not on file  . Number of children: Not on file  . Years of education: Not on file  . Highest education level: Not on file  Occupational History  . Not on file  Social Needs  . Financial resource strain: Not on file  . Food insecurity:    Worry: Not on file    Inability: Not on file  . Transportation needs:    Medical: Not on file    Non-medical: Not on file  Tobacco Use  . Smoking status: Never Smoker  . Smokeless tobacco: Never Used  Substance and Sexual Activity  . Alcohol use: Yes    Alcohol/week: 0.0 standard drinks  . Drug use: [name]  . Sexual activity: Yes    Birth control/protection: None  Lifestyle  . Physical activity:    Days per week: Not on file    Minutes per session: Not on file  . Stress: Not on file  Relationships  . Social connections:    Talks on phone: Not on file    Gets together: Not on file    Attends religious service: Not on file    Active member of club or organization: Not on file    Attends meetings of clubs or organizations: Not on file    Relationship status: Not on file  Other Topics Concern  . Not on file  Social History Narrative  . Not on file   [name] Known Allergies Family History  Problem  Relation Age of Onset  . Cancer Father          Current Outpatient Medications (Other):  .  gabapentin (NEURONTIN) 100 MG capsule, Take 2 capsules (200 mg total) by mouth at bedtime. .  valACYclovir (VALTREX) 500 MG tablet, Take 1 tablet (500 mg total) by mouth continuous as needed.    Past medical history, social, surgical and family history all reviewed in electronic medical record.  [name] pertanent information unless stated regarding to the chief complaint.   Review of Systems:  [name] headache, visual changes, nausea, vomiting, diarrhea, constipation, dizziness, abdominal pain, skin rash, fevers, chills, night sweats, weight loss, swollen lymph nodes, body aches, joint swelling,  chest pain, shortness of breath, mood changes.  Positive muscle aches  Objective  Blood pressure 122/80, pulse 81, height 5' 8.5" (1.74 m), weight 195 lb (88.5 kg), SpO2 98 %.    General: [name] apparent distress alert and oriented x3 mood and affect normal, dressed appropriately.  HEENT: Pupils equal, extraocular movements intact  Respiratory: Patient's speak in full sentences and does not appear short of breath  Cardiovascular: [name] lower extremity edema, non tender, [name] erythema  Skin: Warm dry intact with [name] signs of infection or  rash on extremities or on axial skeleton.  Abdomen: Soft nontender  Neuro: Cranial nerves II through XII are intact, neurovascularly intact in all extremities with 2+ DTRs and 2+ pulses.  Lymph: [name] lymphadenopathy of posterior or anterior cervical chain or axillae bilaterally.  Gait normal with good balance and coordination.  MSK:  Non tender with full range of motion and good stability and symmetric strength and tone of shoulders, elbows, wrist, hip, knee and ankles bilaterally.  Neck: Inspection mild loss of lordosis. [name] palpable stepoffs. Negative Spurling's maneuver. Mild limited range of motion lacks last 5 degrees of extension Grip strength and sensation normal in bilateral  hands Strength good C4 to T1 distribution [name] sensory change to C4 to T1 Negative Hoffman sign bilaterally Reflexes normal Right trapezius tightness.  Osteopathic findings C2 flexed rotated and side bent right C6 flexed rotated and side bent left T3 extended rotated and side bent left inhaled third rib T6 extended rotated and side bent left L3 flexed rotated and side bent left  Sacrum right on right     Impression and Recommendations:     This case required medical decision making of moderate complexity. The above documentation has been reviewed and is accurate and complete Jennifer Pulley, DO       Note: This dictation was prepared with Dragon dictation along with smaller phrase technology. Any transcriptional errors that result from this process are unintentional.

## 2018-05-19 ENCOUNTER — Ambulatory Visit: Payer: BLUE CROSS/BLUE SHIELD | Admitting: Family Medicine

## 2018-05-19 ENCOUNTER — Encounter: Payer: Self-pay | Admitting: Family Medicine

## 2018-05-19 VITALS — BP 122/80 | HR 81 | Ht 68.5 in | Wt 195.0 lb

## 2018-05-19 DIAGNOSIS — M9903 Segmental and somatic dysfunction of lumbar region: Secondary | ICD-10-CM | POA: Diagnosis not present

## 2018-05-19 DIAGNOSIS — M999 Biomechanical lesion, unspecified: Secondary | ICD-10-CM

## 2018-05-19 DIAGNOSIS — G2589 Other specified extrapyramidal and movement disorders: Secondary | ICD-10-CM | POA: Insufficient documentation

## 2018-05-19 DIAGNOSIS — M9904 Segmental and somatic dysfunction of sacral region: Secondary | ICD-10-CM | POA: Diagnosis not present

## 2018-05-19 DIAGNOSIS — M9902 Segmental and somatic dysfunction of thoracic region: Secondary | ICD-10-CM | POA: Diagnosis not present

## 2018-05-19 DIAGNOSIS — M9901 Segmental and somatic dysfunction of cervical region: Secondary | ICD-10-CM

## 2018-05-19 NOTE — Patient Instructions (Signed)
Good to see you  Ice is your friend Ice 20 minutes 2 times daily. Usually after activity and before bed. Stay active Exercises 3 times a week.  Keep hands within peripheral vison  Over the counter  Vitamin D 2000 IU daily  Iron 65mg  and 500 mg of vitamin C  See me again in 6 weeks

## 2018-05-19 NOTE — Assessment & Plan Note (Signed)
Posture and ergonomics.  Discussed home exercise, icing regimen, which activities to do which was to avoid.  Discussed avoiding certain activities.  Patient will follow-up with me again in 4 to 8 weeks responded well to manipulation

## 2018-05-19 NOTE — Assessment & Plan Note (Signed)
Decision today to treat with OMT was based on Physical Exam  After verbal consent patient was treated with HVLA, ME, FPR techniques in cervical, rib thoracic, lumbar and sacral areas  Patient tolerated the procedure well with improvement in symptoms  Patient given exercises, stretches and lifestyle modifications  See medications in patient instructions if given  Patient will follow up in 4-6 weeks

## 2018-07-28 DIAGNOSIS — Z1231 Encounter for screening mammogram for malignant neoplasm of breast: Secondary | ICD-10-CM | POA: Diagnosis not present

## 2018-08-10 DIAGNOSIS — Z01419 Encounter for gynecological examination (general) (routine) without abnormal findings: Secondary | ICD-10-CM | POA: Diagnosis not present

## 2018-08-10 DIAGNOSIS — Z1151 Encounter for screening for human papillomavirus (HPV): Secondary | ICD-10-CM | POA: Diagnosis not present

## 2018-08-10 DIAGNOSIS — Z118 Encounter for screening for other infectious and parasitic diseases: Secondary | ICD-10-CM | POA: Diagnosis not present

## 2018-08-10 DIAGNOSIS — Z6828 Body mass index (BMI) 28.0-28.9, adult: Secondary | ICD-10-CM | POA: Diagnosis not present

## 2018-09-05 DIAGNOSIS — D251 Intramural leiomyoma of uterus: Secondary | ICD-10-CM | POA: Diagnosis not present

## 2018-09-05 DIAGNOSIS — D252 Subserosal leiomyoma of uterus: Secondary | ICD-10-CM | POA: Diagnosis not present

## 2018-09-05 DIAGNOSIS — N920 Excessive and frequent menstruation with regular cycle: Secondary | ICD-10-CM | POA: Diagnosis not present

## 2018-10-24 DIAGNOSIS — M26629 Arthralgia of temporomandibular joint, unspecified side: Secondary | ICD-10-CM | POA: Diagnosis not present

## 2018-12-14 DIAGNOSIS — N920 Excessive and frequent menstruation with regular cycle: Secondary | ICD-10-CM | POA: Diagnosis not present

## 2022-01-29 ENCOUNTER — Other Ambulatory Visit: Payer: Self-pay | Admitting: Obstetrics and Gynecology

## 2022-01-29 DIAGNOSIS — D250 Submucous leiomyoma of uterus: Secondary | ICD-10-CM

## 2022-02-09 ENCOUNTER — Other Ambulatory Visit (HOSPITAL_COMMUNITY): Payer: Self-pay | Admitting: Interventional Radiology

## 2022-02-09 ENCOUNTER — Ambulatory Visit
Admission: RE | Admit: 2022-02-09 | Discharge: 2022-02-09 | Disposition: A | Discharge status: Home / Self Care | Payer: No Typology Code available for payment source | Source: Ambulatory Visit | Attending: Obstetrics and Gynecology | Admitting: Obstetrics and Gynecology

## 2022-02-09 DIAGNOSIS — D250 Submucous leiomyoma of uterus: Secondary | ICD-10-CM

## 2022-02-09 DIAGNOSIS — D251 Intramural leiomyoma of uterus: Secondary | ICD-10-CM

## 2022-02-09 DIAGNOSIS — N920 Excessive and frequent menstruation with regular cycle: Secondary | ICD-10-CM

## 2022-02-09 HISTORY — PX: IR RADIOLOGIST EVAL & MGMT: IMG5224

## 2022-02-09 NOTE — Consult Note (Addendum)
Chief Complaint: Patient was seen in consultation today for symptomatic uterine fibroids at the request of Cousins,Sheronette  Referring Physician(s): Cousins,Sheronette  History of Present Illness: Jennifer [name] is a 51 y.o. female diagnosed 10 to 15 years ago with uterine fibroids.  Over the past 12 months she has had worsening heavy bleeding during her menstrual cycle.  She is on birth control pills so the timing of the cycle is relatively predictable, menstruation lasting about 1 week with 4 days of heavy bleeding requiring changing both pads and tampons for 5 times per day.  She does not have any bleeding between periods.  She has not had a diagnosis of anemia.  She is not on iron supplementation.  Pelvic pain she ranks 2-4 out of 10 on the visual analog pain scale.  No  urinary frequency or other bulk symptoms.  No previous fibroid surgery.  Pap smear in December was normal.  Recent endometrial biopsy was negative. UFS-QoL symptom severity score 21. HRQL (total) 68. No past medical history on file.  No past surgical history on file.  Allergies: Patient has no known allergies.  Medications: Prior to Admission medications   Medication Sig Start Date End Date Taking? Authorizing Provider  gabapentin (NEURONTIN) 100 MG capsule Take 2 capsules (200 mg total) by mouth at bedtime. 01/30/16   Lyndal Pulley, DO  valACYclovir (VALTREX) 500 MG tablet Take 1 tablet (500 mg total) by mouth continuous as needed. 05/10/12   Haygood, Seymour Bars, MD     Family History  Problem Relation Age of Onset   Cancer Father     Social History   Socioeconomic History   Marital status: Single    Spouse name: Not on file   Number of children: Not on file   Years of education: Not on file   Highest education level: Not on file  Occupational History   Not on file  Tobacco Use   Smoking status: Never   Smokeless tobacco: Never  Substance and Sexual Activity   Alcohol use: Yes     Alcohol/week: 0.0 standard drinks of alcohol   Drug use: No   Sexual activity: Yes    Birth control/protection: None  Other Topics Concern   Not on file  Social History Narrative   Not on file   Social Determinants of Health   Financial Resource Strain: Not on file  Food Insecurity: Not on file  Transportation Needs: Not on file  Physical Activity: Not on file  Stress: Not on file  Social Connections: Not on file    ECOG Status: 1 - Symptomatic but completely ambulatory    Vital Signs: There were no vitals taken for this visit.    Physical Exam Constitutional: Oriented to person, place, and time. Well-developed and well-nourished. No distress.   HENT:  Head: Normocephalic and atraumatic.  Eyes: Conjunctivae and EOM are normal. Right eye exhibits no discharge. Left eye exhibits no discharge. No scleral icterus.  Neck: No JVD present.  Pulmonary/Chest: Effort normal. No stridor. No respiratory distress.  Abdomen: soft, non distended Neurological:  alert and oriented to person, place, and time.  Skin: Skin is warm and dry.  not diaphoretic.  Psychiatric:   normal mood and affect.   behavior is normal. Judgment and thought content normal.   Mallampati Score: Review of Systems: A 12 point ROS discussed and pertinent positives are indicated in the HPI above.  All other systems are negative.     Imaging: Transvaginal ultrasound 10/17/2021, not  available for direct review, reported at least 7 fibroids measuring up to 6.9 cm diameter.  Labs:  CBC: No results for input(s): "WBC", "HGB", "HCT", "PLT" in the last 8760 hours.  COAGS: No results for input(s): "INR", "APTT" in the last 8760 hours.  BMP: No results for input(s): "NA", "K", "CL", "CO2", "GLUCOSE", "BUN", "CALCIUM", "CREATININE", "GFRNONAA", "GFRAA" in the last 8760 hours.  Invalid input(s): "CMP"  LIVER FUNCTION TESTS: No results for input(s): "BILITOT", "AST", "ALT", "ALKPHOS", "PROT", "ALBUMIN" in the  last 8760 hours.  TUMOR MARKERS: No results for input(s): "AFPTM", "CEA", "CA199", "CHROMGRNA" in the last 8760 hours.  Assessment and Plan:  My impression is that this patient's menorrhagia is  likely secondary to uterine fibroids. We spent the majority of the consultation discussing the pathophysiology of uterine leiomyomata, natural history, anticipated  involution post menopause, and treatment options. We discussed myomectomy, hysterectomy, and uterine fibroid embolization. I described the technique of UFE, anticipated benefits, possible risks and complications including but not limited to bleeding, infection, vessel damage, nontarget embolization, and incomplete symptom relief. We discussed the 90% clinical success rate historically and at our experience with UFE. We discussed the post procedure course and time course of symptom resolution. We discussed the need for continued gynecologic care.  She seemed to understand and did ask appropriate questions, which were answered.  Based on her evaluation thus far, I think she would be an appropriate candidate for uterine fibroid embolization because of her symptomatology and  uterine fibroids. To complete her evaluation and workup, I would require pelvic MRI with contrast to best determine the exact site of location of her uterine fibroids, specifically to exclude any pedunculated fibroids on a narrow stalk, as well as to exclude any unexpected pelvic pathology.    After our discussion, the patient was motivated proceed. Accordingly, we can set up the MRI   at her convenience as an outpatient. If this looks okay, she would like to proceed with UFE.   Thank you for this interesting consult.  I greatly enjoyed meeting Jennifer [name] and look forward to participating in their care.  A copy of this report was sent to the requesting provider on this date.  Electronically Signed: Rickard Rhymes 02/09/2022, 4:05 PM   I spent a total of  40 Minutes    in face to face in clinical consultation, greater than 50% of which was counseling/coordinating care for menorrhagia in the setting of uterine fibroids.

## 2022-02-10 ENCOUNTER — Encounter: Payer: Self-pay | Admitting: Lab

## 2022-02-27 ENCOUNTER — Other Ambulatory Visit: Payer: Self-pay | Admitting: Interventional Radiology

## 2022-02-27 DIAGNOSIS — N920 Excessive and frequent menstruation with regular cycle: Secondary | ICD-10-CM

## 2022-02-27 DIAGNOSIS — D251 Intramural leiomyoma of uterus: Secondary | ICD-10-CM

## 2022-03-05 ENCOUNTER — Ambulatory Visit
Admission: RE | Admit: 2022-03-05 | Discharge: 2022-03-05 | Disposition: A | Discharge status: Home / Self Care | Payer: No Typology Code available for payment source | Source: Ambulatory Visit | Attending: Interventional Radiology | Admitting: Interventional Radiology

## 2022-03-05 DIAGNOSIS — N920 Excessive and frequent menstruation with regular cycle: Secondary | ICD-10-CM

## 2022-03-05 DIAGNOSIS — D251 Intramural leiomyoma of uterus: Secondary | ICD-10-CM

## 2022-03-05 MED ORDER — GADOPICLENOL 0.5 MMOL/ML IV SOLN
8.0000 mL | Freq: Once | INTRAVENOUS | Status: AC | PRN
  Administered 2022-03-05: 8 mL via INTRAVENOUS

## 2022-03-16 ENCOUNTER — Other Ambulatory Visit (HOSPITAL_COMMUNITY): Payer: Self-pay | Admitting: Interventional Radiology

## 2022-03-16 DIAGNOSIS — D259 Leiomyoma of uterus, unspecified: Secondary | ICD-10-CM

## 2022-05-05 ENCOUNTER — Other Ambulatory Visit: Payer: Self-pay | Admitting: Radiology

## 2022-05-05 DIAGNOSIS — D259 Leiomyoma of uterus, unspecified: Secondary | ICD-10-CM

## 2022-05-06 NOTE — H&P (Signed)
    Referring Physician(s): Cousins,S  Supervising Physician: Arne Cleveland  Patient Status:  [facility]   Chief Complaint:  Symptomatic uterine fibroids  Subjective: Patient known to IR service from consultation with Dr. Vernard Gambles on 02/09/2022 to discuss treatment options for symptomatic uterine fibroids.  She was deemed an appropriate candidate for bilateral uterine artery embolization and presents today for the procedure.     No past medical history on file. Past Surgical History:  Procedure Laterality Date   IR RADIOLOGIST EVAL & MGMT  02/09/2022      Allergies: Patient has no known allergies.  Medications: Prior to Admission medications   Medication Sig Start Date End Date Taking? Authorizing Provider  gabapentin (NEURONTIN) 100 MG capsule Take 2 capsules (200 mg total) by mouth at bedtime. 01/30/16   Lyndal Pulley, DO  valACYclovir (VALTREX) 500 MG tablet Take 1 tablet (500 mg total) by mouth continuous as needed. 05/10/12   HaygoodSeymour Bars, MD     Vital Signs:   Physical Exam  Imaging: No results found.  Labs:  CBC: No results for input(s): "WBC", "HGB", "HCT", "PLT" in the last 8760 hours.  COAGS: No results for input(s): "INR", "APTT" in the last 8760 hours.  BMP: No results for input(s): "NA", "K", "CL", "CO2", "GLUCOSE", "BUN", "CALCIUM", "CREATININE", "GFRNONAA", "GFRAA" in the last 8760 hours.  Invalid input(s): "CMP"  LIVER FUNCTION TESTS: No results for input(s): "BILITOT", "AST", "ALT", "ALKPHOS", "PROT", "ALBUMIN" in the last 8760 hours.  Assessment and Plan: Patient known to IR service from consultation with Dr. Vernard Gambles on 02/09/2022 to discuss treatment options for symptomatic uterine fibroids.  She was deemed an appropriate candidate for bilateral uterine artery embolization and presents today for the procedure.Risks and benefits of procedure were discussed with the patient including, but not limited to bleeding, infection, vascular  injury or contrast induced renal failure.  This interventional procedure involves the use of X-rays and because of the nature of the planned procedure, it is possible that we will have prolonged use of X-ray fluoroscopy.  Potential radiation risks to you include (but are not limited to) the following: - A slightly elevated risk for cancer  several years later in life. This risk is typically less than 0.5% percent. This risk is low in comparison to the normal incidence of human cancer, which is 33% for women and 50% for men according to the [place]. - Radiation induced injury can include skin redness, resembling a rash, tissue breakdown / ulcers and hair loss (which can be temporary or permanent).   The likelihood of either of these occurring depends on the difficulty of the procedure and whether you are sensitive to radiation due to previous procedures, disease, or genetic conditions.   IF your procedure requires a prolonged use of radiation, you will be notified and given written instructions for further action.  It is your responsibility to monitor the irradiated area for the 2 weeks following the procedure and to notify your physician if you are concerned that you have suffered a radiation induced injury.    All of the patient's questions were answered, patient is agreeable to proceed.  Consent signed and in chart.      Electronically Signed: D. Rowe Robert, PA-C 05/06/2022, 3:37 PM   I spent a total of 30 minutes at the the patient's bedside AND on the patient's hospital floor or unit, greater than 50% of which was counseling/coordinating care for bilateral uterine artery embolization

## 2022-05-07 ENCOUNTER — Observation Stay (HOSPITAL_COMMUNITY)
Admission: RE | Admit: 2022-05-07 | Discharge: 2022-05-08 | Disposition: A | Discharge status: Home / Self Care | Payer: No Typology Code available for payment source | Source: Ambulatory Visit | Attending: Interventional Radiology | Admitting: Interventional Radiology

## 2022-05-07 ENCOUNTER — Ambulatory Visit (HOSPITAL_COMMUNITY)
Admission: RE | Admit: 2022-05-07 | Discharge: 2022-05-07 | Disposition: A | Discharge status: Home / Self Care | Payer: No Typology Code available for payment source | Source: Ambulatory Visit | Attending: Interventional Radiology | Admitting: Interventional Radiology

## 2022-05-07 ENCOUNTER — Other Ambulatory Visit: Payer: Self-pay | Admitting: Radiology

## 2022-05-07 ENCOUNTER — Other Ambulatory Visit (HOSPITAL_COMMUNITY): Payer: Self-pay | Admitting: Interventional Radiology

## 2022-05-07 ENCOUNTER — Encounter (HOSPITAL_COMMUNITY): Payer: Self-pay

## 2022-05-07 DIAGNOSIS — D259 Leiomyoma of uterus, unspecified: Secondary | ICD-10-CM

## 2022-05-07 DIAGNOSIS — Z79899 Other long term (current) drug therapy: Secondary | ICD-10-CM | POA: Diagnosis not present

## 2022-05-07 HISTORY — PX: IR ANGIOGRAM PELVIS SELECTIVE OR SUPRASELECTIVE: IMG661

## 2022-05-07 HISTORY — PX: IR US GUIDE VASC ACCESS LEFT: IMG2389

## 2022-05-07 HISTORY — PX: IR EMBO TUMOR ORGAN ISCHEMIA INFARCT INC GUIDE ROADMAPPING: IMG5449

## 2022-05-07 HISTORY — PX: IR ANGIOGRAM SELECTIVE EACH ADDITIONAL VESSEL: IMG667

## 2022-05-07 LAB — CBC WITH DIFFERENTIAL/PLATELET
Abs Immature Granulocytes: 0.02 10*3/uL (ref 0.00–0.07)
Basophils Absolute: 0 10*3/uL (ref 0.0–0.1)
Basophils Relative: 1 %
Eosinophils Absolute: 0.4 10*3/uL (ref 0.0–0.5)
Eosinophils Relative: 7 %
HCT: 34.2 % — ABNORMAL LOW (ref 36.0–46.0)
Hemoglobin: 10.9 g/dL — ABNORMAL LOW (ref 12.0–15.0)
Immature Granulocytes: 0 %
Lymphocytes Relative: 27 %
Lymphs Abs: 1.6 10*3/uL (ref 0.7–4.0)
MCH: 28.2 pg (ref 26.0–34.0)
MCHC: 31.9 g/dL (ref 30.0–36.0)
MCV: 88.6 fL (ref 80.0–100.0)
Monocytes Absolute: 0.6 10*3/uL (ref 0.1–1.0)
Monocytes Relative: 10 %
Neutro Abs: 3.4 10*3/uL (ref 1.7–7.7)
Neutrophils Relative %: 55 %
Platelets: 215 10*3/uL (ref 150–400)
RBC: 3.86 MIL/uL — ABNORMAL LOW (ref 3.87–5.11)
RDW: 13.6 % (ref 11.5–15.5)
WBC: 6 10*3/uL (ref 4.0–10.5)
nRBC: 0 % (ref 0.0–0.2)

## 2022-05-07 LAB — BASIC METABOLIC PANEL
Anion gap: 7 (ref 5–15)
BUN: 12 mg/dL (ref 6–20)
CO2: 23 mmol/L (ref 22–32)
Calcium: 8.8 mg/dL — ABNORMAL LOW (ref 8.9–10.3)
Chloride: 108 mmol/L (ref 98–111)
Creatinine, Ser: 0.9 mg/dL (ref 0.44–1.00)
GFR, Estimated: >60 mL/min (ref >60)
Glucose, Bld: 99 mg/dL (ref 70–99)
Potassium: 3.8 mmol/L (ref 3.5–5.1)
Sodium: 138 mmol/L (ref 135–145)

## 2022-05-07 LAB — PROTIME-INR
INR: 1.1 (ref 0.8–1.2)
Prothrombin Time: 14 seconds (ref 11.4–15.2)

## 2022-05-07 LAB — HCG, SERUM, QUALITATIVE: Preg, Serum: NEGATIVE

## 2022-05-07 MED ORDER — MIDAZOLAM HCL 2 MG/2ML IJ SOLN
INTRAMUSCULAR | Status: AC
  Filled 2022-05-07: qty 2

## 2022-05-07 MED ORDER — ONDANSETRON HCL 4 MG/2ML IJ SOLN
4.0000 mg | Freq: Once | INTRAMUSCULAR | Status: AC
  Administered 2022-05-07: 4 mg via INTRAVENOUS
  Filled 2022-05-07: qty 2

## 2022-05-07 MED ORDER — DIPHENHYDRAMINE HCL 50 MG/ML IJ SOLN: 12.5000 mg | Freq: Four times a day (QID) | INTRAMUSCULAR | Status: DC | PRN

## 2022-05-07 MED ORDER — HYDROCODONE-ACETAMINOPHEN 5-325 MG PO TABS
1.0000 | ORAL_TABLET | ORAL | Status: DC | PRN
  Administered 2022-05-07 – 2022-05-08 (×2): 2 via ORAL
  Filled 2022-05-07 (×2): qty 2

## 2022-05-07 MED ORDER — VERAPAMIL HCL 2.5 MG/ML IV SOLN
INTRAVENOUS | Status: AC
  Filled 2022-05-07: qty 2

## 2022-05-07 MED ORDER — HYDROMORPHONE 1 MG/ML IV SOLN
INTRAVENOUS | Status: DC
  Administered 2022-05-07: 2.7 mg via INTRAVENOUS
  Administered 2022-05-07: 3.3 mg via INTRAVENOUS
  Administered 2022-05-08: 0.3 mg via INTRAVENOUS
  Administered 2022-05-08: 1.8 mg via INTRAVENOUS
  Filled 2022-05-07 (×11): qty 30

## 2022-05-07 MED ORDER — HYDROMORPHONE HCL 1 MG/ML IJ SOLN
INTRAMUSCULAR | Status: AC
  Filled 2022-05-07: qty 1

## 2022-05-07 MED ORDER — SODIUM CHLORIDE 0.9 % IV SOLN: INTRAVENOUS | Status: DC

## 2022-05-07 MED ORDER — ONDANSETRON HCL 4 MG/2ML IJ SOLN
4.0000 mg | Freq: Four times a day (QID) | INTRAMUSCULAR | Status: DC | PRN
  Administered 2022-05-07 – 2022-05-08 (×2): 4 mg via INTRAVENOUS
  Filled 2022-05-07 (×2): qty 2

## 2022-05-07 MED ORDER — VERAPAMIL HCL 2.5 MG/ML IV SOLN: INTRA_ARTERIAL | Status: AC | PRN

## 2022-05-07 MED ORDER — VERAPAMIL HCL 2.5 MG/ML IV SOLN
INTRAVENOUS | Status: AC | PRN
  Administered 2022-05-07: 2.5 mg via INTRA_ARTERIAL

## 2022-05-07 MED ORDER — NITROGLYCERIN IN D5W 100-5 MCG/ML-% IV SOLN
INTRAVENOUS | Status: AC
  Filled 2022-05-07: qty 250

## 2022-05-07 MED ORDER — NITROGLYCERIN 1 MG/10 ML FOR IR/CATH LAB
INTRA_ARTERIAL | Status: AC | PRN
  Administered 2022-05-07: 100 ug via INTRA_ARTERIAL

## 2022-05-07 MED ORDER — FENTANYL CITRATE (PF) 100 MCG/2ML IJ SOLN
INTRAMUSCULAR | Status: AC | PRN
  Administered 2022-05-07 (×2): 25 ug via INTRAVENOUS
  Administered 2022-05-07 (×3): 50 ug via INTRAVENOUS

## 2022-05-07 MED ORDER — DEXAMETHASONE SODIUM PHOSPHATE 10 MG/ML IJ SOLN
10.0000 mg | Freq: Once | INTRAMUSCULAR | Status: AC
  Administered 2022-05-07: 10 mg via INTRAVENOUS
  Filled 2022-05-07: qty 1

## 2022-05-07 MED ORDER — SODIUM CHLORIDE 0.9% FLUSH: 9.0000 mL | INTRAVENOUS | Status: DC | PRN

## 2022-05-07 MED ORDER — IOHEXOL 300 MG/ML  SOLN
50.0000 mL | Freq: Once | INTRAMUSCULAR | Status: AC | PRN
  Administered 2022-05-07: 30 mL via INTRA_ARTERIAL

## 2022-05-07 MED ORDER — CEFAZOLIN SODIUM-DEXTROSE 2-4 GM/100ML-% IV SOLN
2.0000 g | INTRAVENOUS | Status: AC
  Administered 2022-05-07: 2 g via INTRAVENOUS
  Filled 2022-05-07: qty 100

## 2022-05-07 MED ORDER — HEPARIN SODIUM (PORCINE) 1000 UNIT/ML IJ SOLN
INTRAMUSCULAR | Status: AC
  Filled 2022-05-07: qty 10

## 2022-05-07 MED ORDER — KETOROLAC TROMETHAMINE 30 MG/ML IJ SOLN
30.0000 mg | Freq: Once | INTRAMUSCULAR | Status: AC
  Administered 2022-05-07: 30 mg via INTRAVENOUS

## 2022-05-07 MED ORDER — HYDROMORPHONE HCL 1 MG/ML IJ SOLN
INTRAMUSCULAR | Status: AC | PRN
  Administered 2022-05-07: 1 mg via INTRAVENOUS

## 2022-05-07 MED ORDER — KETOROLAC TROMETHAMINE 30 MG/ML IJ SOLN
INTRAMUSCULAR | Status: AC
  Filled 2022-05-07: qty 1

## 2022-05-07 MED ORDER — FENTANYL CITRATE (PF) 100 MCG/2ML IJ SOLN
INTRAMUSCULAR | Status: AC
  Filled 2022-05-07: qty 2

## 2022-05-07 MED ORDER — KETOROLAC TROMETHAMINE 30 MG/ML IJ SOLN
30.0000 mg | INTRAMUSCULAR | Status: AC
  Administered 2022-05-07: 30 mg via INTRAVENOUS
  Filled 2022-05-07: qty 1

## 2022-05-07 MED ORDER — DIPHENHYDRAMINE HCL 12.5 MG/5ML PO ELIX: 12.5000 mg | ORAL_SOLUTION | Freq: Four times a day (QID) | ORAL | Status: DC | PRN

## 2022-05-07 MED ORDER — IOHEXOL 300 MG/ML  SOLN
100.0000 mL | Freq: Once | INTRAMUSCULAR | Status: AC | PRN
  Administered 2022-05-07: 30 mL via INTRA_ARTERIAL

## 2022-05-07 MED ORDER — MIDAZOLAM HCL 2 MG/2ML IJ SOLN
INTRAMUSCULAR | Status: AC | PRN
  Administered 2022-05-07: .5 mg via INTRAVENOUS
  Administered 2022-05-07 (×2): 1 mg via INTRAVENOUS
  Administered 2022-05-07 (×3): .5 mg via INTRAVENOUS

## 2022-05-07 MED ORDER — LIDOCAINE HCL 1 % IJ SOLN
INTRAMUSCULAR | Status: AC
  Administered 2022-05-07: 2 mL
  Filled 2022-05-07: qty 20

## 2022-05-07 MED ORDER — NALOXONE HCL 0.4 MG/ML IJ SOLN: 0.4000 mg | INTRAMUSCULAR | Status: DC | PRN

## 2022-05-07 NOTE — Procedures (Signed)
  Procedure:  Bilateral uterine artery embolization via L radial approach Preprocedure diagnosis: The encounter diagnosis was Uterine leiomyoma, unspecified location.  Postprocedure diagnosis: same EBL:    minimal Complications:   none immediate  See full dictation in [facility].  Dillard Cannon MD Main # [phone] Pager  [phone] Mobile [phone]

## 2022-05-07 NOTE — Sedation Documentation (Signed)
Barbeau Type B

## 2022-05-07 NOTE — Progress Notes (Signed)
1240 Lab called about lab results for BMET,HCG  serum and PT INR told 30 more minutes were needed, the lab was running behind.

## 2022-05-08 DIAGNOSIS — D259 Leiomyoma of uterus, unspecified: Secondary | ICD-10-CM | POA: Diagnosis not present

## 2022-05-08 MED ORDER — HYDROCODONE-ACETAMINOPHEN 5-325 MG PO TABS: 1.0000 | ORAL_TABLET | ORAL | 0 refills | Status: AC | PRN

## 2022-05-08 MED ORDER — DOCUSATE SODIUM 100 MG PO CAPS: 100.0000 mg | ORAL_CAPSULE | Freq: Two times a day (BID) | ORAL | 0 refills | Status: AC

## 2022-05-08 MED ORDER — ONDANSETRON HCL 4 MG PO TABS: 4.0000 mg | ORAL_TABLET | Freq: Three times a day (TID) | ORAL | 0 refills | Status: AC | PRN

## 2022-05-08 NOTE — Discharge Summary (Addendum)
Patient ID: Jennifer [name] MRN: [id number] DOB/AGE: 11/25/1970 51 y.o.  Admit date: 05/07/2022 Discharge date: 05/08/2022  Supervising Physician: Vernard Gambles, Daniel/Suttle,D  Patient Status: [facility] - In-pt  Admission Diagnoses: Symptomatic uterine fibroids  Discharge Diagnoses: Symptomatic uterine fibroids, status post bilateral uterine artery embolization on 05/07/2022 Active Problems:   * No active hospital problems. * History reviewed. No pertinent past medical history. Past Surgical History:  Procedure Laterality Date   IR ANGIOGRAM PELVIS SELECTIVE OR SUPRASELECTIVE  05/07/2022   IR ANGIOGRAM SELECTIVE EACH ADDITIONAL VESSEL  05/07/2022   IR ANGIOGRAM SELECTIVE EACH ADDITIONAL VESSEL  05/07/2022   IR EMBO TUMOR ORGAN ISCHEMIA INFARCT INC GUIDE ROADMAPPING  05/07/2022   IR RADIOLOGIST EVAL & MGMT  02/09/2022   IR US GUIDE VASC ACCESS LEFT  05/07/2022     Discharged Condition: good  Hospital Course: Jennifer [name] is a 51 year old female with history of symptomatic uterine fibroids who underwent consultation with Dr. Vernard Gambles on 02/09/2022 to discuss treatment options. She was deemed an appropriate candidate for bilateral uterine artery embolization and underwent the procedure at [facility] on 05/07/2022.  The procedure was performed without immediate complications and she was subsequently admitted to the hospital for overnight observation for pain control.  She was placed on Dilaudid PCA pump.  Postprocedure her only complaint was pelvic cramping as expected.  On the day of discharge she was stable.  She did have some mild pelvic cramping but denied fever, headache, chest pain, dyspnea, cough, nausea, vomiting ,dysuria or hematuria. She was able to tolerate her diet, ambulate and void without significant difficulty.  She was deemed stable for discharge home at this time.  Electronic prescriptions were sent to patient's pharmacy for Vicodin, Colace and Zofran.  She will follow-up  with Dr. Vernard Gambles in the IR clinic in 3 to 4 weeks.  She will continue gynecologic care with Dr. Garwin Brothers.  She was told to contact our service with any additional questions.  Consults: None  Significant Diagnostic Studies:  Results for orders placed or performed during the hospital encounter of 05/07/22  CBC with Differential/Platelet  Result Value Ref Range   WBC 6.0 4.0 - 10.5 K/uL   RBC 3.86 (L) 3.87 - 5.11 MIL/uL   Hemoglobin 10.9 (L) 12.0 - 15.0 g/dL   HCT 34.2 (L) 36.0 - 46.0 %   MCV 88.6 80.0 - 100.0 fL   MCH 28.2 26.0 - 34.0 pg   MCHC 31.9 30.0 - 36.0 g/dL   RDW 13.6 11.5 - 15.5 %   Platelets 215 150 - 400 K/uL   nRBC 0.0 0.0 - 0.2 %   Neutrophils Relative % 55 %   Neutro Abs 3.4 1.7 - 7.7 K/uL   Lymphocytes Relative 27 %   Lymphs Abs 1.6 0.7 - 4.0 K/uL   Monocytes Relative 10 %   Monocytes Absolute 0.6 0.1 - 1.0 K/uL   Eosinophils Relative 7 %   Eosinophils Absolute 0.4 0.0 - 0.5 K/uL   Basophils Relative 1 %   Basophils Absolute 0.0 0.0 - 0.1 K/uL   Immature Granulocytes 0 %   Abs Immature Granulocytes 0.02 0.00 - 0.07 K/uL  Protime-INR  Result Value Ref Range   Prothrombin Time 14.0 11.4 - 15.2 seconds   INR 1.1 0.8 - 1.2  Basic metabolic panel  Result Value Ref Range   Sodium 138 135 - 145 mmol/L   Potassium 3.8 3.5 - 5.1 mmol/L   Chloride 108 98 - 111 mmol/L   CO2 23 22 -  32 mmol/L   Glucose, Bld 99 70 - 99 mg/dL   BUN 12 6 - 20 mg/dL   Creatinine, Ser 0.90 0.44 - 1.00 mg/dL   Calcium 8.8 (L) 8.9 - 10.3 mg/dL   GFR, Estimated >60 >60 mL/min   Anion gap 7 5 - 15  hCG, serum, qualitative  Result Value Ref Range   Preg, Serum NEGATIVE NEGATIVE     Treatments: Bilateral uterine artery embolization on 05/07/2022 via left radial artery access  Discharge Exam: Blood pressure (!) 165/80, pulse (!) 56, temperature (!) 97.5 F (36.4 C), resp. rate 17, height '5\' 8"'$  (1.727 m), weight 196 lb (88.9 kg), last menstrual period 05/02/2022, SpO2 97 %. Awake, alert.   Chest clear to auscultation bilaterally.  Heart with slightly bradycardic but regular rhythm.  Abdomen soft, positive bowel sounds, mildly tender lower anterior abdominal region to palpation, no lower extremity edema, intact distal pulses, puncture site left radial artery soft, clean, dry, nontender, no hematoma, intact pulses.  Disposition: Discharge disposition: 01-Home or Self Care       Discharge Instructions     Call MD for:  difficulty breathing, headache or visual disturbances   Complete by: As directed    Call MD for:  extreme fatigue   Complete by: As directed    Call MD for:  hives   Complete by: As directed    Call MD for:  persistant dizziness or light-headedness   Complete by: As directed    Call MD for:  persistant nausea and vomiting   Complete by: As directed    Call MD for:  redness, tenderness, or signs of infection (pain, swelling, redness, odor or green/yellow discharge around incision site)   Complete by: As directed    Call MD for:  severe uncontrolled pain   Complete by: As directed    Call MD for:  temperature >100.4   Complete by: As directed    Change dressing (specify)   Complete by: As directed    May apply Band-Aid to puncture site left wrist daily for the next 2 to 3 days.  May wash site with soap and water.   Diet - low sodium heart healthy   Complete by: As directed    Discharge instructions   Complete by: As directed    Stay well-hydrated, avoid strenuous activity for 1 week, may resume home medications, take Advil 600 mg orally every 6 hours for the next 5 days then taper to normal dosing-take with food; if pain not relieved with Advil may take narcotic medication and take also with food; no driving after taking narcotic medication   Driving Restrictions   Complete by: As directed    No driving for the next 24 hours or after taking narcotic medication   Increase activity slowly   Complete by: As directed    Lifting restrictions   Complete by:  As directed    No heavy lifting or strenuous activity for 1 week   May shower / Bathe   Complete by: As directed    May walk up steps   Complete by: As directed    Sexual Activity Restrictions   Complete by: As directed    No sexual intercourse for 1 week      Allergies as of 05/08/2022   No Known Allergies      Medication List     TAKE these medications    docusate sodium 100 MG capsule Commonly known as: Colace Take 1 capsule (100 mg  total) by mouth 2 (two) times daily.   gabapentin 100 MG capsule Commonly known as: NEURONTIN Take 2 capsules (200 mg total) by mouth at bedtime.   HYDROcodone-acetaminophen 5-325 MG tablet Commonly known as: NORCO/VICODIN Take 1-2 tablets by mouth every 4 (four) hours as needed for moderate pain.   norethindrone-ethinyl estradiol-FE 1-20 MG-MCG tablet Commonly known as: LOESTRIN FE Take 1 tablet by mouth daily.   ondansetron 4 MG tablet Commonly known as: Zofran Take 1 tablet (4 mg total) by mouth every 8 (eight) hours as needed for nausea or vomiting.   valACYclovir 500 MG tablet Commonly known as: VALTREX Take 1 tablet (500 mg total) by mouth continuous as needed.               Discharge Care Instructions  (From admission, onward)           Start     Ordered   05/08/22 0000  Change dressing (specify)       Comments: May apply Band-Aid to puncture site left wrist daily for the next 2 to 3 days.  May wash site with soap and water.   05/08/22 7824            Follow-up Information     Arne Cleveland, MD Follow up.   Specialties: Interventional Radiology, Radiology Why: Radiology team will contact you for follow-up with Dr. Vernard Gambles in 3 to 4 weeks; call [phone] or [phone] with any questions Contact information: [address] [place] [address] [phone]         Servando Salina, MD Follow up.   Specialty: Obstetrics and Gynecology Why: Follow-up with Dr. Garwin Brothers as  scheduled Contact information: [address] [place] [address] [phone]                  Electronically Signed: D. Rowe Robert, PA-C 05/08/2022, 9:41 AM   I have spent Less Than 30 Minutes discharging Jennifer [name].

## 2022-05-08 NOTE — Discharge Instructions (Signed)
May resume home medications, stay well-hydrated, avoid strenuous activity for 1 week, take Advil 600 mg orally every 6 hours for the next 5 days then taper dosing, take Advil with food; if pain not relieved with Advil may take narcotic medication-take with food; no driving after taking narcotic medication

## 2022-05-08 NOTE — TOC CM/SW Note (Signed)
Transition of Care [facility]) Screening Note  Patient Details  Name: Jennifer [name] Date of Birth: [birth year]  Transition of Care [facility]) CM/SW Contact:    Sherie Don, LCSW Phone Number: 05/08/2022, 8:34 AM  Transition of Care Department [facility]) has reviewed patient and no TOC needs have been identified at this time. We will continue to monitor patient advancement through interdisciplinary progression rounds. If new patient transition needs arise, please place a TOC consult.

## 2022-05-08 NOTE — Progress Notes (Signed)
Patient discharging home with husband. Vital signs stable at time of discharge as reflected in discharge summary. Discharge instructions given and verbal understanding returned. No questions at this time.

## 2022-05-08 NOTE — Progress Notes (Signed)
Per Mar discontinued Dilaudid PCA pump. Unable to waste in pyxis. Wasted 33m of dilaudid with [facility] personobserving.

## 2022-06-10 ENCOUNTER — Ambulatory Visit
Admission: RE | Admit: 2022-06-10 | Discharge: 2022-06-10 | Disposition: A | Discharge status: Home / Self Care | Payer: BLUE CROSS/BLUE SHIELD | Source: Ambulatory Visit | Attending: Radiology | Admitting: Radiology

## 2022-06-10 DIAGNOSIS — D259 Leiomyoma of uterus, unspecified: Secondary | ICD-10-CM

## 2022-06-10 NOTE — Progress Notes (Signed)
Patient ID: Jennifer [name], female   DOB: 1970/10/13, 52 y.o.   MRN: [id number]       Chief Complaint: Patient was consulted remotely today ([place]) for Follow-up post   UFE  at the request of Allred,Darrell K.    Referring Physician(s): Cousins,Sheronette  History of Present Illness: Jennifer [name] is a 52 y.o. female diagnosed 10 to 15 years ago with uterine fibroids.  Over the past 12 months   worsening heavy bleeding during her menstrual cycle.  No  diagnosis of anemia.  She is not on iron supplementation.  Pelvic pain   2-4 out of 10 on the visual analog pain scale.  No  urinary frequency or other bulk symptoms.  No previous fibroid surgery.  03/05/2022 MRI pelvis Diffuse uterine involvement by numerous fibroids, largest measuring 8.8 cm. 5.3 cm pedunculated fibroid arising from the posterior lower uterine segment, broad base of attachment the uterus measuring approximately 4 cm in thickness. 05/07/2012 patient went uncomplicated transradial bilateral uterine artery embolization.Her overnight observation was uneventful and she was discharged home the following morning.  She did well postprocedure.  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  She is back to regular activity.Her radial entry site is well-healed.  No past medical history on file.  Past Surgical History:  Procedure Laterality Date   IR ANGIOGRAM PELVIS SELECTIVE OR SUPRASELECTIVE  05/07/2022   IR ANGIOGRAM SELECTIVE EACH ADDITIONAL VESSEL  05/07/2022   IR ANGIOGRAM SELECTIVE EACH ADDITIONAL VESSEL  05/07/2022   IR EMBO TUMOR ORGAN ISCHEMIA INFARCT INC GUIDE ROADMAPPING  05/07/2022   IR RADIOLOGIST EVAL & MGMT  02/09/2022   IR US GUIDE VASC ACCESS LEFT  05/07/2022    Allergies: Patient has no known allergies.  Medications: Prior to Admission medications   Medication Sig Start Date End Date Taking? Authorizing Provider  docusate sodium (COLACE) 100 MG capsule  Take 1 capsule (100 mg total) by mouth 2 (two) times daily. 05/08/22   Allred, Shirlyn Goltz, PA-C  HYDROcodone-acetaminophen (NORCO/VICODIN) 5-325 MG tablet Take 1-2 tablets by mouth every 4 (four) hours as needed for moderate pain. 05/08/22   Allred, Darrell K, PA-C  norethindrone-ethinyl estradiol-FE (LOESTRIN FE) 1-20 MG-MCG tablet Take 1 tablet by mouth daily.    [provider]  ondansetron (ZOFRAN) 4 MG tablet Take 1 tablet (4 mg total) by mouth every 8 (eight) hours as needed for nausea or vomiting. 05/08/22   Allred, Darrell K, PA-C  valACYclovir (VALTREX) 500 MG tablet Take 1 tablet (500 mg total) by mouth continuous as needed. Patient taking differently: Take 500 mg by mouth continuous as needed (cold sores). 05/10/12   Haygood, Seymour Bars, MD     Family History  Problem Relation Age of Onset   Cancer Father     Social History   Socioeconomic History   Marital status: Single    Spouse name: Not on file   Number of children: Not on file   Years of education: Not on file   Highest education level: Not on file  Occupational History   Not on file  Tobacco Use   Smoking status: Never   Smokeless tobacco: Never  Substance and Sexual Activity   Alcohol use: Yes    Alcohol/week: 0.0 standard drinks of alcohol   Drug use: No   Sexual activity: Yes    Birth control/protection: None  Other Topics Concern   Not on file  Social History Narrative   Not on file  Social Determinants of Health   Financial Resource Strain: Not on file  Food Insecurity: Not on file  Transportation Needs: Not on file  Physical Activity: Not on file  Stress: Not on file  Social Connections: Not on file    ECOG Status: 1 - Symptomatic but completely ambulatory  Review of Systems  Review of Systems: A 12 point ROS discussed and pertinent positives are indicated in the HPI above.  All other systems are negative.    Physical Exam No direct physical exam was performed (except for noted  visual exam findings with Video Visits).    Vital Signs: There were no vitals taken for this visit.  Imaging:  BILATERAL UTERINE ARTERY EMBOLIZATION   TECHNIQUE: The procedure, risks, benefits, and alternatives were explained to the patient. Questions regarding the procedure were encouraged and answered. The patient understands and consents to the procedure. As antibiotic prophylaxis, cefazolin 2 gwas ordered pre-procedure and administered intravenously within one hour of incision.   Ultrasound survey of the left wrist was performed with images stored and sent to PACS. Diameter radial artery measured 2.6 mm.   1% lidocaine was used for local anesthesia.   A micropuncture needle was used access the left radial artery under ultrasound. With excellent arterial blood flow returned, and an .018 micro wire was passed through the needle into the radial artery. The needle was removed, and a 80F Glidesheath Slender was placed over the wire. The inner dilator and wire were removed, and the sheath was flushed.   Radial cocktail was then infused slowly, after diluting the volume with ~15cc of blood.   A benson wire was then used to navigate an angled 4 French catheter into the descending aorta.   The catheter was used to selectively catheterize the left internal iliac artery for pelvic arteriography. A coaxial catheter was advanced with the shapeable 016 wire and used to selectively catheterize the left uterine artery. The microcatheter tip was positioned in the distal horizontal segment. Selective arteriogram confirms appropriate positioning. Distal branches of the left uterine artery were embolized with 500-700 micron Embospheres. Embolization continued until near stasis of flow was achieved. Microcatheter was withdrawn and a followup selective left internal iliac arteriogram was obtained. The guide catheter was partially withdrawn, then advanced to the contralateral side and the  right internal iliac artery was selectively catheterized. Again the microcatheter with 016 guidewire was coaxially advanced and used to selectively catheterize the right uterine artery. Confirmatory arteriogram was performed. Right uterine artery branches were embolized with 500-700 micron Embospheres to near stasis of flow. A total of 4 vials of Embospheres were utilized for the case. Microcatheter was withdrawn and a followup arteriogram of the right internal iliac artery was performed. The angiographic catheter removed. Additional nitroglycerin and verapamil infused slowly through the radial sheath. The sheath was removed and hemostasis achieved with the TR band. Patient tolerated the procedure well.   FLUOROSCOPY TIME:  Radiation Exposure Index (as provided by the fluoroscopic device): 610 mGy air Kerma   COMPLICATIONS: COMPLICATIONS None immediate   IMPRESSION: 1. Technically successful bilateral uterine artery embolization using 500-700 micron Embospheres.     Electronically Signed   By: Lucrezia Europe M.D.   On: 05/07/2022 15:39  Labs:  CBC: Recent Labs    05/07/22 1158  WBC 6.0  HGB 10.9*  HCT 34.2*  PLT 215    COAGS: Recent Labs    05/07/22 1158  INR 1.1    BMP: Recent Labs    05/07/22 1158  NA 138  K 3.8  CL 108  CO2 23  GLUCOSE 99  BUN 12  CALCIUM 8.8*  CREATININE 0.90  GFRNONAA >60    LIVER FUNCTION TESTS: No results for input(s): "BILITOT", "AST", "ALT", "ALKPHOS", "PROT", "ALBUMIN" in the last 8760 hours.  TUMOR MARKERS: No results for input(s): "AFPTM", "CEA", "CA199", "CHROMGRNA" in the last 8760 hours.  Assessment and Plan:  My impression is that this patient has done very well in the first month post uterine fibroid embolization.  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  We discussed the expectation that the fibroids will continue to involute over 3-6 months with  continued improvement of symptoms over that timeframe. Menstrual cycles may be quite irregular.  I have no activity restrictions for her at this time.  I did again caution her that should she notice passage of any tissue fragments which might suggest fibroid fragmentation instead of involution, she would let need to let me know ASAP as this would put her at risk for possible infection and could be easily treated with D&C, a relatively unusual delayed complication but worth being aware of.  Otherwise, we will will follow up with her at the 64-monthmark to make sure she is doing well.  She knows to call in the interval should she have any questions or problems possibly related to the uterine fibroid embolization procedure. I reminded her to continue her routine schedule gynecologic care as well.     Thank you for this interesting consult.  I greatly enjoyed meeting ENiza Soderholmand look forward to participating in their care.  A copy of this report was sent to the requesting provider on this date.  Electronically Signed: DRickard Rhymes1/31/2024, 4:40 PM   I spent a total of    25 Minutes in remote  clinical consultation, greater than 50% of which was counseling/coordinating care for fibroids post UFE.    Visit type: Audio only (telephone). Audio (no video) only due to patient's lack of internet/smartphone capability. [facility] [place] This format is felt to be most appropriate for this patient at this time.  All issues noted in this document were discussed and addressed.

## 2022-12-08 ENCOUNTER — Other Ambulatory Visit: Payer: Self-pay | Admitting: Interventional Radiology

## 2022-12-08 DIAGNOSIS — D259 Leiomyoma of uterus, unspecified: Secondary | ICD-10-CM

## 2023-07-21 ENCOUNTER — Encounter: Payer: Self-pay | Admitting: Podiatry

## 2023-07-21 ENCOUNTER — Ambulatory Visit (INDEPENDENT_AMBULATORY_CARE_PROVIDER_SITE_OTHER)

## 2023-07-21 ENCOUNTER — Ambulatory Visit: Admitting: Podiatry

## 2023-07-21 DIAGNOSIS — L309 Dermatitis, unspecified: Secondary | ICD-10-CM

## 2023-07-21 DIAGNOSIS — B49 Unspecified mycosis: Secondary | ICD-10-CM | POA: Diagnosis not present

## 2023-07-21 DIAGNOSIS — M79671 Pain in right foot: Secondary | ICD-10-CM

## 2023-07-21 DIAGNOSIS — M79672 Pain in left foot: Secondary | ICD-10-CM

## 2023-07-21 DIAGNOSIS — M21619 Bunion of unspecified foot: Secondary | ICD-10-CM | POA: Diagnosis not present

## 2023-07-21 MED ORDER — TERBINAFINE HCL 250 MG PO TABS: 250.0000 mg | ORAL_TABLET | Freq: Every day | ORAL | 0 refills | Status: AC

## 2023-07-21 NOTE — Progress Notes (Signed)
 Subjective:   Patient ID: Jennifer [name], female   DOB: 53 y.o.   MRN: [id number]   HPI Patient states she has had a lot of itching in her right foot and has had blisters and has bunion deformity right over left that are just starting to become symptomatic.  Patient does not smoke likes to be active   Review of Systems  All other systems reviewed and are negative.       Objective:  Physical Exam Vitals and nursing note reviewed.  Constitutional:      Appearance: She is well-developed.  Pulmonary:     Effort: Pulmonary effort is normal.  Musculoskeletal:        General: Normal range of motion.  Skin:    General: Skin is warm.  Neurological:     Mental Status: She is alert.     Neurovascular status intact muscle strength found to be adequate range of motion within normal limits with patient noted to have blistering of the right forefoot and between the digits with also excessive sweating and also structural hyperostosis medial aspect first metatarsal head right over left      Assessment:  Numerous different problems with structural bunion deformity and also fungal infection dermatitis condition     Plan:  H&P reviewed and discussed structural bunion correction I do think ultimately this would be a good correction for her but given her x-rays I would recommend we hold off currently.  I am going to start her on Lamisil oral for 30 days and she will start techniques for sweat reduction and will be seen back as needed and may pursue Botox for her sweating  X-rays indicate elevation of the 1 2 intermetatarsal angle right over left with prominence of the first metatarsal head right
